# Patient Record
Sex: Male | Born: 1995 | Race: White | Hispanic: No | Marital: Single | Smoking: Never smoker
Health system: Southern US, Community
[De-identification: ages and names within clinical notes are randomized; demographics above are authoritative.]

## PROBLEM LIST (undated history)

## (undated) DIAGNOSIS — T7840XA Allergy, unspecified, initial encounter: Secondary | ICD-10-CM

## (undated) DIAGNOSIS — Q676 Pectus excavatum: Secondary | ICD-10-CM

## (undated) HISTORY — DX: Allergy, unspecified, initial encounter: T78.40XA

## (undated) HISTORY — DX: Pectus excavatum: Q67.6

---
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
## 2014-10-17 ENCOUNTER — Ambulatory Visit (INDEPENDENT_AMBULATORY_CARE_PROVIDER_SITE_OTHER): Payer: Managed Care, Other (non HMO) | Admitting: Emergency Medicine

## 2014-10-17 VITALS — BP 124/82 | HR 75 | Temp 97.5°F | Resp 18 | Ht 67.0 in | Wt 134.0 lb

## 2014-10-17 DIAGNOSIS — S81812A Laceration without foreign body, left lower leg, initial encounter: Secondary | ICD-10-CM

## 2014-10-17 DIAGNOSIS — Z23 Encounter for immunization: Secondary | ICD-10-CM

## 2014-10-17 DIAGNOSIS — M79605 Pain in left leg: Secondary | ICD-10-CM | POA: Diagnosis not present

## 2014-10-17 NOTE — Progress Notes (Signed)
Subjective:  Patient ID: Darren Horn, male    DOB: [birth year]  Age: 19 y.o. MRN: [id number]  CC: Leg Injury   HPI Darren AntuZachary T Kil presents  with a laceration of his leg. He apparently doing something with a knife and slipped and dropped on his right lateral Thigh. His current no neurovascular complaint. Not current on tetanus he has moderate pain in his right lateral thigh  History Darren Horn has a past medical history of Pectus excavatum and Allergy.   He has no past surgical history on file.   His  family history includes Asthma in his father; Cancer in his maternal grandmother; Pectus excavatum in his brother.  He   reports that he has never smoked. He does not have any smokeless tobacco history on file. He reports that he does not drink alcohol or use illicit drugs.  Outpatient Prescriptions Prior to Visit  Medication Sig Dispense Refill  . amoxicillin (AMOXIL) 875 MG tablet Take 1 tablet (875 mg total) by mouth 2 (two) times daily. (Patient not taking: Reported on 10/17/2014) 20 tablet 0  . Diphenhyd-Hydrocort-Nystatin (FIRST-DUKES MOUTHWASH) SUSP Take 10 mLs by mouth every 2 (two) hours as needed. With 1:1 ratio viscous lidocaine (Patient not taking: Reported on 10/17/2014) 360 mL 0  . albuterol (PROVENTIL HFA;VENTOLIN HFA) 108 (90 BASE) MCG/ACT inhaler Inhale 2 puffs into the lungs every 4 (four) hours as needed for wheezing. 1 Inhaler 0   No facility-administered medications prior to visit.    History   Social History  . Marital Status: Single    Spouse Name: N/A  . Number of Children: N/A  . Years of Education: N/A   Social History Main Topics  . Smoking status: Never Smoker   . Smokeless tobacco: Not on file  . Alcohol Use: No  . Drug Use: No  . Sexual Activity: Not Currently   Other Topics Concern  . None   Social History Narrative     Review of Systems  Constitutional: Negative for fever, chills and appetite change.  HENT: Negative for  congestion, ear pain, postnasal drip, sinus pressure and sore throat.   Eyes: Negative for pain and redness.  Respiratory: Negative for cough, shortness of breath and wheezing.   Cardiovascular: Negative for leg swelling.  Gastrointestinal: Negative for nausea, vomiting, abdominal pain, diarrhea, constipation and blood in stool.  Endocrine: Negative for polyuria.  Genitourinary: Negative for dysuria, urgency, frequency and flank pain.  Musculoskeletal: Negative for gait problem.  Skin: Negative for rash.  Neurological: Negative for weakness and headaches.  Psychiatric/Behavioral: Negative for confusion and decreased concentration. The patient is not nervous/anxious.     Objective:  BP 124/82 mmHg  Pulse 75  Temp(Src) 97.5 F (36.4 C) (Oral)  Resp 18  Ht 5\' 7"  (1.702 m)  Wt 134 lb (60.782 kg)  BMI 20.98 kg/m2  SpO2 98%  Physical Exam  Constitutional: He is oriented to person, place, and time. He appears well-developed and well-nourished.  HENT:  Head: Normocephalic and atraumatic.  Eyes: Conjunctivae are normal. Pupils are equal, round, and reactive to light.  Pulmonary/Chest: Effort normal.  Musculoskeletal: He exhibits no edema.  Neurological: He is alert and oriented to person, place, and time.  Skin: Skin is dry.  Psychiatric: He has a normal mood and affect. His behavior is normal. Thought content normal.      Assessment & Plan:   Darren Horn was seen today for leg injury.  Diagnoses and all orders for this visit:  Need for  Tdap vaccination Orders: -     Tdap vaccine greater than or equal to 7yo IM   I have discontinued Mr. Vivona albuterol. I am also having him maintain his FIRST-DUKES MOUTHWASH and amoxicillin.  No orders of the defined types were placed in this encounter.    Appropriate red flag conditions were discussed with the patient as well as actions that should be taken.  Patient expressed his understanding.  Follow-up: No Follow-up on  file.  Carmelina Dane, MD

## 2014-10-17 NOTE — Progress Notes (Signed)
Procedure Consent obtained. Local anesthesia with 6 cc 1% lido. Cleaned with soap and water. Wound explored. #7 5-0 simple interrupted. Clean dressing placed. Care instructions given.

## 2014-10-24 ENCOUNTER — Ambulatory Visit (INDEPENDENT_AMBULATORY_CARE_PROVIDER_SITE_OTHER): Payer: Managed Care, Other (non HMO) | Admitting: Physician Assistant

## 2014-10-24 VITALS — BP 110/70 | HR 69 | Temp 97.8°F | Resp 16 | Ht 67.0 in | Wt 133.8 lb

## 2014-10-24 DIAGNOSIS — Z4802 Encounter for removal of sutures: Secondary | ICD-10-CM

## 2014-10-24 NOTE — Progress Notes (Signed)
[facility] [address], [place] [address] [phone]  Date:  10/24/2014   Name:  Darren Horn Seng   DOB:  10-Feb-1996   MRN:  [id number]  PCP:  No PCP Per Patient    History of Present Illness:  Darren Horn Dial is a 19 y.o. male patient who [facility] removal of a laceration at his right thigh status post suture repair 7 days ago. Patient reports no problems with the wound site he has been keeping it clean with soap and water. He has no swelling, increased redness, drainage, streaking or fever that he has noticed.   Patient Active Problem List   Diagnosis Date Noted  . Pectus excavatum 12/16/2012    Past Medical History  Diagnosis Date  . Pectus excavatum   . Allergy     History reviewed. No pertinent past surgical history.  History  Substance Use Topics  . Smoking status: Never Smoker   . Smokeless tobacco: Not on file  . Alcohol Use: No    Family History  Problem Relation Age of Onset  . Pectus excavatum Brother   . Asthma Father   . Cancer Maternal Grandmother     No Known Allergies  Medication list has been reviewed and updated.  Current Outpatient Prescriptions on File Prior to Visit  Medication Sig Dispense Refill  . amoxicillin (AMOXIL) 875 MG tablet Take 1 tablet (875 mg total) by mouth 2 (two) times daily. (Patient not taking: Reported on 10/17/2014) 20 tablet 0  . Diphenhyd-Hydrocort-Nystatin (FIRST-DUKES MOUTHWASH) SUSP Take 10 mLs by mouth every 2 (two) hours as needed. With 1:1 ratio viscous lidocaine (Patient not taking: Reported on 10/17/2014) 360 mL 0   No current facility-administered medications on file prior to visit.    ROS ROS otherwise unremarkable unless listed above.  Physical Examination: BP 110/70 mmHg  Pulse 69  Temp(Src) 97.8 F (36.6 C) (Oral)  Resp 16  Ht 5\' 7"  (1.702 m)  Wt 133 lb 12.8 oz (60.691 kg)  BMI 20.95 kg/m2  SpO2 99% Ideal Body Weight: Weight in (lb) to have BMI = 25:  159.3  Physical Exam Alert, cooperative, oriented 4 in no acute distress. Right thigh with repaired laceration. Sutures are intact. There is no drainage or sanguinous fluid from the wound site. Sutures were removed without any complication and it was cleansed with soap and water upon  Assessment and Plan: 19 year old male with past medical history listed above is here today for suture removal. Laceration is healing well. Sutures were removed. Advised to keep wound site clean with soap and water and advised him of alarming symptoms that would warrant return.  Visit for suture removal    Trena PlattStephanie Sady Monaco, PA-C [facility] 10/24/2014 12:31 PM

## 2014-10-24 NOTE — Patient Instructions (Signed)
The sutures came out fine.  Please make sure you keep the are clean with soap and water twice per day.  If you notice increase swelling, drainage, bleeding, or pain in this area, please return to the clinic.

## 2015-02-12 ENCOUNTER — Telehealth: Payer: Self-pay

## 2015-02-12 NOTE — Telephone Encounter (Signed)
Received a request for records and bills from [employer] [facility] [employer]. Faxed an invoice for $10.00 on 02/12/2015. Awaiting payment. Records filed in the bottom drawer.

## 2015-02-14 NOTE — Telephone Encounter (Signed)
Scanned in the itemized bill did not see any paper work in the draw.

## 2015-03-01 DIAGNOSIS — Z0271 Encounter for disability determination: Secondary | ICD-10-CM

## 2015-11-09 ENCOUNTER — Emergency Department (HOSPITAL_COMMUNITY): Payer: Managed Care, Other (non HMO)

## 2015-11-09 ENCOUNTER — Ambulatory Visit (INDEPENDENT_AMBULATORY_CARE_PROVIDER_SITE_OTHER): Payer: Managed Care, Other (non HMO)

## 2015-11-09 ENCOUNTER — Ambulatory Visit (INDEPENDENT_AMBULATORY_CARE_PROVIDER_SITE_OTHER)
Admission: EM | Admit: 2015-11-09 | Discharge: 2015-11-09 | Disposition: A | Discharge status: Nursing Home (Includes ICF) | Payer: Managed Care, Other (non HMO) | Source: Home / Self Care | Attending: Emergency Medicine | Admitting: Emergency Medicine

## 2015-11-09 ENCOUNTER — Encounter (HOSPITAL_COMMUNITY): Payer: Self-pay | Admitting: *Deleted

## 2015-11-09 ENCOUNTER — Encounter (HOSPITAL_COMMUNITY): Payer: Self-pay | Admitting: Emergency Medicine

## 2015-11-09 ENCOUNTER — Inpatient Hospital Stay (HOSPITAL_COMMUNITY)
Admission: EM | Admit: 2015-11-09 | Discharge: 2015-11-13 | DRG: 482 | Disposition: A | Discharge status: Home / Self Care | Payer: Managed Care, Other (non HMO) | Attending: Family Medicine | Admitting: Family Medicine

## 2015-11-09 DIAGNOSIS — E876 Hypokalemia: Secondary | ICD-10-CM | POA: Diagnosis present

## 2015-11-09 DIAGNOSIS — S72142A Displaced intertrochanteric fracture of left femur, initial encounter for closed fracture: Secondary | ICD-10-CM

## 2015-11-09 DIAGNOSIS — T148XXA Other injury of unspecified body region, initial encounter: Secondary | ICD-10-CM

## 2015-11-09 DIAGNOSIS — S72142D Displaced intertrochanteric fracture of left femur, subsequent encounter for closed fracture with routine healing: Secondary | ICD-10-CM | POA: Diagnosis not present

## 2015-11-09 DIAGNOSIS — S70312A Abrasion, left thigh, initial encounter: Secondary | ICD-10-CM | POA: Diagnosis present

## 2015-11-09 DIAGNOSIS — D696 Thrombocytopenia, unspecified: Secondary | ICD-10-CM | POA: Diagnosis present

## 2015-11-09 DIAGNOSIS — W1789XA Other fall from one level to another, initial encounter: Secondary | ICD-10-CM | POA: Diagnosis present

## 2015-11-09 DIAGNOSIS — M25552 Pain in left hip: Secondary | ICD-10-CM | POA: Diagnosis present

## 2015-11-09 DIAGNOSIS — Y92832 Beach as the place of occurrence of the external cause: Secondary | ICD-10-CM | POA: Diagnosis not present

## 2015-11-09 LAB — BASIC METABOLIC PANEL
ANION GAP: 7 (ref 5–15)
BUN: 12 mg/dL (ref 6–20)
CALCIUM: 9.1 mg/dL (ref 8.9–10.3)
CO2: 26 mmol/L (ref 22–32)
Chloride: 103 mmol/L (ref 101–111)
Creatinine, Ser: 0.77 mg/dL (ref 0.61–1.24)
GFR calc Af Amer: >60 mL/min (ref >60)
GFR calc non Af Amer: >60 mL/min (ref >60)
GLUCOSE: 98 mg/dL (ref 65–99)
Potassium: 3.4 mmol/L — ABNORMAL LOW (ref 3.5–5.1)
Sodium: 136 mmol/L (ref 135–145)

## 2015-11-09 LAB — CBC WITH DIFFERENTIAL/PLATELET
BASOS PCT: 0 %
Basophils Absolute: 0 10*3/uL (ref 0.0–0.1)
EOS PCT: 2 %
Eosinophils Absolute: 0.2 10*3/uL (ref 0.0–0.7)
HEMATOCRIT: 44.1 % (ref 39.0–52.0)
Hemoglobin: 15.3 g/dL (ref 13.0–17.0)
Lymphocytes Relative: 24 %
Lymphs Abs: 2 10*3/uL (ref 0.7–4.0)
MCH: 28.5 pg (ref 26.0–34.0)
MCHC: 34.7 g/dL (ref 30.0–36.0)
MCV: 82.3 fL (ref 78.0–100.0)
MONO ABS: 0.7 10*3/uL (ref 0.1–1.0)
MONOS PCT: 9 %
NEUTROS ABS: 5.5 10*3/uL (ref 1.7–7.7)
Neutrophils Relative %: 65 %
Platelets: 120 10*3/uL — ABNORMAL LOW (ref 150–400)
RBC: 5.36 MIL/uL (ref 4.22–5.81)
RDW: 12.3 % (ref 11.5–15.5)
WBC: 8.4 10*3/uL (ref 4.0–10.5)

## 2015-11-09 LAB — URINALYSIS, ROUTINE W REFLEX MICROSCOPIC
GLUCOSE, UA: 100 mg/dL — AB
Hgb urine dipstick: NEGATIVE
KETONES UR: 15 mg/dL — AB
Leukocytes, UA: NEGATIVE
NITRITE: NEGATIVE
PH: 6.5 (ref 5.0–8.0)
Protein, ur: 30 mg/dL — AB
Specific Gravity, Urine: 1.037 — ABNORMAL HIGH (ref 1.005–1.030)

## 2015-11-09 LAB — URINE MICROSCOPIC-ADD ON: RBC / HPF: NONE SEEN RBC/hpf (ref 0–5)

## 2015-11-09 LAB — TYPE AND SCREEN
ABO/RH(D): A NEG
Antibody Screen: NEGATIVE

## 2015-11-09 MED ORDER — MORPHINE SULFATE (PF) 4 MG/ML IV SOLN
4.0000 mg | Freq: Once | INTRAVENOUS | Status: AC
  Administered 2015-11-09: 4 mg via INTRAVENOUS
  Filled 2015-11-09: qty 1

## 2015-11-09 MED ORDER — POTASSIUM CHLORIDE CRYS ER 20 MEQ PO TBCR
40.0000 meq | EXTENDED_RELEASE_TABLET | Freq: Once | ORAL | Status: AC
  Administered 2015-11-09: 40 meq via ORAL
  Filled 2015-11-09: qty 2

## 2015-11-09 MED ORDER — IBUPROFEN 800 MG PO TABS
ORAL_TABLET | ORAL | Status: AC
  Filled 2015-11-09: qty 1

## 2015-11-09 MED ORDER — HYDROMORPHONE HCL 1 MG/ML IJ SOLN
INTRAMUSCULAR | Status: AC
  Filled 2015-11-09: qty 2

## 2015-11-09 MED ORDER — IBUPROFEN 800 MG PO TABS
800.0000 mg | ORAL_TABLET | Freq: Once | ORAL | Status: AC
  Administered 2015-11-09: 800 mg via ORAL

## 2015-11-09 MED ORDER — HYDROMORPHONE HCL 1 MG/ML IJ SOLN
2.0000 mg | Freq: Once | INTRAMUSCULAR | Status: AC
  Administered 2015-11-09: 2 mg via INTRAMUSCULAR

## 2015-11-09 NOTE — ED Notes (Signed)
Report    Phoned  To sabrina     Film/video editor

## 2015-11-09 NOTE — H&P (Signed)
History and Physical  Patient Name: Darren Horn     ZOX:[id number]    DOB: 1995-04-17    DOA: 11/09/2015 Referring provider: Ophelia Charter PCP: No PCP Per Patient   Patient coming from: Home     Chief Complaint: Hip pain and fall  HPI: Darren Horn is a 20 y.o. male with no significant past medical history who presents with hip pain after a fall.  The patient was in his normal state of health until yesterday evening when he was down in [place] with his brother, was walking on a concrete embankment (maybe 18 in. wide) when he slipped off and fell about three feet landing on concrete on his LEFT side/hip.  He had immediate LEFT hip pain.  It was around 7PM when it happened, around the time of the last ferry off [address], and he and his brother decided they could wait the night because he was able to limp, but overnight he was in a lot of pain, and so today they took the first ferry off the [place] and drove straight home and came to the ER.  In the ED, the patient was LEFT and a plain radiograph of the pelvis showed a minimally displaced left intertrochanteric hip fracure.  The case was discussed with Dr. Ophelia Charter who agreed to see the patient, and [facility] were asked to admit for medical management.    There was no preceding dizziness, weakness, lightheadedness or vertigo, no chest discomfort, palpitations, or dyspnea, nor are there any of those symptoms now.  The patient denies active heart issues, angina, or history of MI. He is active and able to exert >4 METS without difficulty.  He has no history of TIAs/CVAs, CAD, CHF, or DM treated with insulin. He does not use alcohol, and has no history of withdrawal symptoms.  Anesthesia Specific concerns: Presence of loose teeth: None Anesthesia problems in past: None History of bleeding disorder: None  Review of Systems:  Pt complains of LEFT hip pain with movement. All other systems negative except as just noted or noted in the history of  present illness.        Past Medical History:  Diagnosis Date  . Allergy   . Pectus excavatum     History reviewed. No pertinent surgical history.  Had some eye surgeries as a baby.  Social History: Patient lives with his parents.  He does not work.  Patient walks unassisted.  No smoking.    No Known Allergies  Family history: family history includes Asthma in his father; Cancer in his maternal grandmother; Pectus excavatum in his brother.  Prior to Admission medications   None       Physical Exam: BP 115/79   Pulse (!) 50   Temp 98.3 F (36.8 C) (Oral)   Resp 18   SpO2 97%  General appearance: Thin adult male, alert and in no acute distress.  Possible developmental delay.   Eyes: Anicteric, conjunctiva pink, wide, mild amblyopia.  Lids and lashes normal.     ENT: No nasal deformity, discharge, or epistaxis.  OP moist without lesions.   Skin: Warm and dry.  No jaundice.  No suspicious rashes or lesions.  Bilateral ingrown 1st great toenails. Cardiac: Pectus. RRR, nl S1-S2, no murmurs appreciated.  Capillary refill is brisk.  JVP normal.  No LE edema.  Radial and DP pulses 2+ and symmetric. Respiratory: Normal respiratory rate and rhythm.  CTAB without rales or wheezes. GI: Abdomen soft without rigidity.  No  TTP. No ascites, distension.   MSK: No deformities or effusions. Neuro: Cranial nerves grossly intact.  Sensorium intact and responding to questions, attention normal.  Speech is fluent.  Moves all extremities equally and with normal coordination.    Psych: Behavior appropriate.  Affect normal.  No evidence of aural or visual hallucinations or delusions.       Labs on Admission:  I have personally reviewed following labs and imaging studies: CBC:  Recent Labs Lab 11/09/15 2100  WBC 8.4  NEUTROABS 5.5  HGB 15.3  HCT 44.1  MCV 82.3  PLT 120*   Basic Metabolic Panel:  Recent Labs Lab 11/09/15 2100  NA 136  K 3.4*  CL 103  CO2 26  GLUCOSE 98  BUN  12  CREATININE 0.77  CALCIUM 9.1   GFR: CrCl cannot be calculated (Unknown ideal weight.).     Radiological Exams on Admission: Personally reviewed: Dg Hip Unilat W Or Wo Pelvis 2-3 Views Left  Result Date: 11/09/2015 CLINICAL DATA:  20 year old male with history of trauma from a fall off of a 4 foot wall landing onto concrete. EXAM: DG HIP (WITH OR WITHOUT PELVIS) 2-3V LEFT COMPARISON:  Left femur radiograph 11/09/2015. FINDINGS: There is a very minimally displaced acute intertrochanteric left hip fracture. No significant angulation. Bony pelvis and right proximal femur as visualized appear intact. IMPRESSION: 1. Acute minimally displaced intertrochanteric fracture of the left hip. Electronically Signed   By: Trudie Reed M.D.   On: 11/09/2015 22:05   Dg Femur Min 2 Views Left  Result Date: 11/09/2015 CLINICAL DATA:  Pain, stepped off the wall yesterday evening. Unable to bear weight. Technologist notes state pain about the hip. EXAM: LEFT FEMUR 2 VIEWS COMPARISON:  None. FINDINGS: Mildly displaced fracture of the left proximal femur is likely intertrochanteric, however abuts the femoral neck. Distal femur is intact. Femoral head is seated in the acetabulum. IMPRESSION: Mildly displaced left proximal femur fracture, likely intertrochanteric. Recommend dedicated hip radiographs. These results will be called to the ordering clinician or representative by the Radiologist Assistant, and communication documented in the PACS or zVision Dashboard. Electronically Signed   By: Rubye Oaks M.D.   On: 11/09/2015 19:44        Assessment and Plan: 1. Hip fracture: The patient will be seen by Dr. Ophelia Charter in the morning at [facility], to evaluate for operative fixation of the LEFT hip.   -Admit to med-surg bed -Hydrocodone-acetaminophen as tolerated for pain -Bed rest, apply ice, document sedation and vitals per Hip fracture protocol -NPO at midnight -MIVF with K -Nutrition consulted -Check  vitamin D   Overall, the patient is at low risk for the planned surgery.  Patient has a RCRI score of 0 (active cardiac condition, CHF, CAD, DM treated with insulin, TIA/CVA, Cr > 2.0). The patient has no active cardiac symptoms, a functional capacity >4 METs, and would be expected to be at average risk for cardiac complications from this intermediate risk procedure. -No further testing needed    2. Hypokalemia: Clinically insignificant.       DVT prophylaxis: SCDs  Diet: NPO Code Status: FULL  Family Communication: Mother and brother at bedside.  Role of hospitalist discussed. Disposition Plan: Anticipate evaluation by Orthopedics and surgical fixation tomorrow, then PT evaluation and discharge to home in 2-3 days. Admission status: INPATIENT for hip fracture, medical surgical bed     Medical decision making and consults: Patient seen at 11:30 PM on 11/09/2015.  The patient was discussed with  Dr. Preston Fleeting. Clinical condition: stable.      Alberteen Sam [facility] Pager [phone]

## 2015-11-09 NOTE — ED Notes (Signed)
Patient being transported to x-ray.

## 2015-11-09 NOTE — ED Triage Notes (Addendum)
PER CARELINK: Patient to ED from [facility] with c/o L upper leg pain since 1830 last night. Patient states he was walking and fell off side of a wall onto concrete (4 feet), landing directly onto L leg. X-ray done at [facility] shows closed L femur fracture. No other injuries. CMS intact distal to injury, pulses 3+ bilaterally. Patient A&O x 4.

## 2015-11-09 NOTE — ED Triage Notes (Signed)
Pt  Darren Horn  And  Injured  His  Left  Lower  Leg   He  States  He  Is  Unable    To bear  Weight  On the  Affected    leg

## 2015-11-09 NOTE — ED Provider Notes (Signed)
CSN: [id number]     Arrival date & time 11/09/15  1655 History   First MD Initiated Contact with Patient 11/09/15 1837     Chief Complaint  Patient presents with  . Fall   (Consider location/radiation/quality/duration/timing/severity/associated sxs/prior Treatment)  HPI   The patient is a 20 year old male who [place] and came to retaining wall and stepped off of it thinking it was a flat level surface and fell approximately 2-3 feet landing on his left leg. Patient denies pain in his left foot, left tib-fib, left knee; but states he has anterior thigh pain and is unable to bear weight. Patient describes this discomfort as "aching" and denies numbness or tingling has taken nothing for pain other than Tylenol at approximately 11:15 this morning. Patient was apparently able to bear weight yesterday at some point which is why they didn't have him removed from the [place]. He drove home and a car for several hours today.   Past Medical History:  Diagnosis Date  . Allergy   . Pectus excavatum    History reviewed. No pertinent surgical history. Family History  Problem Relation Age of Onset  . Pectus excavatum Brother   . Asthma Father   . Cancer Maternal Grandmother    Social History  Substance Use Topics  . Smoking status: Never Smoker  . Smokeless tobacco: Not on file  . Alcohol use No    Review of Systems  Constitutional: Negative.   HENT: Negative.   Eyes: Negative.   Respiratory: Negative.   Cardiovascular: Negative.   Gastrointestinal: Negative.   Endocrine: Negative.   Genitourinary: Negative.   Musculoskeletal: Negative.  Negative for joint swelling.       Complains of pain across anterior thigh and inability to bear weight.   Skin: Negative.  Negative for wound.  Allergic/Immunologic: Negative.   Neurological: Negative.   Hematological: Negative.   Psychiatric/Behavioral: Negative.     Allergies  Review of patient's allergies  indicates no known allergies.  Home Medications   Prior to Admission medications   Medication Sig Start Date End Date Taking? Authorizing Provider  amoxicillin (AMOXIL) 875 MG tablet Take 1 tablet (875 mg total) by mouth 2 (two) times daily. Patient not taking: Reported on 10/17/2014 05/03/12   Godfrey Pick, PA-C  Diphenhyd-Hydrocort-Nystatin (FIRST-DUKES MOUTHWASH) SUSP Take 10 mLs by mouth every 2 (two) hours as needed. With 1:1 ratio viscous lidocaine Patient not taking: Reported on 10/17/2014 05/03/12   Godfrey Pick, PA-C   Meds Ordered and Administered this Visit   Medications  ibuprofen (ADVIL,MOTRIN) tablet 800 mg (800 mg Oral Given 11/09/15 1929)  HYDROmorphone (DILAUDID) injection 2 mg (2 mg Intramuscular Given 11/09/15 2006)    BP 130/72 (BP Location: Right Arm)   Pulse 78   Temp 98.6 F (37 C) (Oral)   Resp 18   SpO2 100%  No data found.   Physical Exam  Constitutional: He is oriented to person, place, and time. He appears well-developed and well-nourished. No distress.  Cardiovascular: Normal rate, regular rhythm, normal heart sounds and intact distal pulses.  Exam reveals no gallop and no friction rub.   No murmur heard. Pulmonary/Chest: Effort normal and breath sounds normal. No respiratory distress. He has no wheezes. He has no rales. He exhibits no tenderness.  Musculoskeletal:       Right upper leg: He exhibits tenderness and bony tenderness. He exhibits no swelling, no edema, no deformity and no laceration.  The patient is  unable to ambulate to treatment area or transfer from wheelchair to examination table.  No surface trauma, bruising.  No erythema or warmth. No deformity, crepitus, or obvious asymmetry of the affected leg compared to the other. Distal motor and neurovascular status intact.  2+ dorsalis pedal pulses bilaterally, left foot warm to touch and CMS intact.    Neurological: He is alert and oriented to person, place, and time.  Skin: Skin is warm and  dry. Capillary refill takes less than 2 seconds. He is not diaphoretic. No pallor.  Nursing note and vitals reviewed.   Urgent Care Course   Clinical Course   Patient was offered an IM injection prior to x-rays. The patient refused and requested oral ibuprofen.  Procedures (including critical care time)  Labs Review Labs Reviewed - No data to display  Imaging Review Dg Femur Min 2 Views Left  Result Date: 11/09/2015 CLINICAL DATA:  Pain, stepped off the wall yesterday evening. Unable to bear weight. Technologist notes state pain about the hip. EXAM: LEFT FEMUR 2 VIEWS COMPARISON:  None. FINDINGS: Mildly displaced fracture of the left proximal femur is likely intertrochanteric, however abuts the femoral neck. Distal femur is intact. Femoral head is seated in the acetabulum. IMPRESSION: Mildly displaced left proximal femur fracture, likely intertrochanteric. Recommend dedicated hip radiographs. These results will be called to the ordering clinician or representative by the Radiologist Assistant, and communication documented in the PACS or zVision Dashboard. Electronically Signed   By: Rubye Oaks M.D.   On: 11/09/2015 19:44      MDM   1. Intertrochanteric fracture of left femur, closed, initial encounter (HCC)    Meds ordered this encounter  Medications  . ibuprofen (ADVIL,MOTRIN) tablet 800 mg  . HYDROmorphone (DILAUDID) injection 2 mg    The usual and customary discharge instructions and warnings were given.  The patient verbalizes understanding and agrees to plan of care.      Servando Salina, NP 11/09/15 2043

## 2015-11-09 NOTE — ED Provider Notes (Signed)
[facility]-EMERGENCY DEPT Provider Note   CSN: [id number] Arrival date & time: 11/09/15  2044  First Provider Contact:  None       History   Chief Complaint Chief Complaint  Patient presents with  . Leg Pain    HPI Darren Horn is a 20 y.o. male.  The history is provided by the patient.  [place] injuring his left hip. He has tried and played since then, but has been too painful. He did sever some abrasions to his left leg. He denies other injury. He went to urgent care where x-rays showed a hip fracture and he was referred to the ED.  Past Medical History:  Diagnosis Date  . Allergy   . Pectus excavatum     Patient Active Problem List   Diagnosis Date Noted  . Pectus excavatum 12/16/2012    History reviewed. No pertinent surgical history.     Home Medications    Prior to Admission medications   Medication Sig Start Date End Date Taking? Authorizing Provider  amoxicillin (AMOXIL) 875 MG tablet Take 1 tablet (875 mg total) by mouth 2 (two) times daily. Patient not taking: Reported on 10/17/2014 05/03/12   Godfrey Pick, PA-C  Diphenhyd-Hydrocort-Nystatin (FIRST-DUKES MOUTHWASH) SUSP Take 10 mLs by mouth every 2 (two) hours as needed. With 1:1 ratio viscous lidocaine Patient not taking: Reported on 10/17/2014 05/03/12   Godfrey Pick, PA-C    Family History Family History  Problem Relation Age of Onset  . Pectus excavatum Brother   . Asthma Father   . Cancer Maternal Grandmother     Social History Social History  Substance Use Topics  . Smoking status: Never Smoker  . Smokeless tobacco: Never Used  . Alcohol use No     Allergies   Review of patient's allergies indicates no known allergies.   Review of Systems Review of Systems  All other systems reviewed and are negative.    Physical Exam Updated Vital Signs BP 127/90 (BP Location: Right Arm)   Pulse 65   Temp 98.3 F (36.8 C) (Oral)    Resp 18   SpO2 98%   Physical Exam  Nursing note and vitals reviewed.  20 year old male, resting comfortably and in no acute distress. Vital signs are normal. Oxygen saturation is 98%, which is normal. Head is normocephalic and atraumatic. PERRLA, EOMI. Oropharynx is clear. Neck is nontender and supple without adenopathy or JVD. Back is nontender and there is no CVA tenderness. Lungs are clear without rales, wheezes, or rhonchi. Chest is nontender. Heart has regular rate and rhythm without murmur. Abdomen is soft, flat, nontender without masses or hepatosplenomegaly and peristalsis is normoactive. Extremities have no cyanosis or edema. There is moderate tenderness to palpation of the left hip and severe pain with passive range of motion. Distal neurovascular exam is intact. Abrasions are seen over the distal left thigh, lateral left knee, proximal left lower leg. Skin is warm and dry without rash. Neurologic: Mental status is normal, cranial nerves are intact, there are no motor or sensory deficits.  ED Treatments / Results  Labs (all labs ordered are listed, but only abnormal results are displayed) Labs Reviewed  BASIC METABOLIC PANEL - Abnormal; Notable for the following:       Result Value   Potassium 3.4 (*)    All other components within normal limits  CBC WITH DIFFERENTIAL/PLATELET - Abnormal; Notable for the  following:    Platelets 120 (*)    All other components within normal limits  URINALYSIS, ROUTINE W REFLEX MICROSCOPIC (NOT AT [facility]) - Abnormal; Notable for the following:    Color, Urine AMBER (*)    Specific Gravity, Urine 1.037 (*)    Glucose, UA 100 (*)    Bilirubin Urine SMALL (*)    Ketones, ur 15 (*)    Protein, ur 30 (*)    All other components within normal limits  URINE MICROSCOPIC-ADD ON - Abnormal; Notable for the following:    Squamous Epithelial / LPF 0-5 (*)    Bacteria, UA RARE (*)    All other components within normal limits  TYPE AND SCREEN    ABO/RH   Radiology Dg Femur Min 2 Views Left  Result Date: 11/09/2015 CLINICAL DATA:  Pain, stepped off the wall yesterday evening. Unable to bear weight. Technologist notes state pain about the hip. EXAM: LEFT FEMUR 2 VIEWS COMPARISON:  None. FINDINGS: Mildly displaced fracture of the left proximal femur is likely intertrochanteric, however abuts the femoral neck. Distal femur is intact. Femoral head is seated in the acetabulum. IMPRESSION: Mildly displaced left proximal femur fracture, likely intertrochanteric. Recommend dedicated hip radiographs. These results will be called to the ordering clinician or representative by the Radiologist Assistant, and communication documented in the PACS or zVision Dashboard. Electronically Signed   By: Rubye Oaks M.D.   On: 11/09/2015 19:44    Procedures Procedures (including critical care time)  Medications Ordered in ED Medications  morphine 4 MG/ML injection 4 mg (4 mg Intravenous Given 11/09/15 2127)  potassium chloride SA (K-DUR,KLOR-CON) CR tablet 40 mEq (40 mEq Oral Given 11/09/15 2319)     Initial Impression / Assessment and Plan / ED Course  I have reviewed the triage vital signs and the nursing notes.  Pertinent labs & imaging results that were available during my care of the patient were reviewed by me and considered in my medical decision making (see chart for details).  Clinical Course    Left hip fracture, probably intertrochanteric. I have discussed the case with Dr. Kevan Ny, on call for orthopedics, who requests dedicated hip x-rays be obtained and that patient be admitted to the medicine service. Case is discussed with Dr. Maryfrances Bunnell of [facility] who agrees to admit the patient.  Final Clinical Impressions(s) / ED Diagnoses   Final diagnoses:  Closed intertrochanteric fracture of left hip, initial encounter [facility])    New Prescriptions New Prescriptions   No medications on file     Dione Booze, MD 11/09/15 2343

## 2015-11-09 NOTE — ED Notes (Signed)
npo

## 2015-11-09 NOTE — ED Notes (Signed)
Provided patient with [place] sandwich and sprite per MD. Patient tolerating well.

## 2015-11-10 ENCOUNTER — Inpatient Hospital Stay (HOSPITAL_COMMUNITY): Payer: Managed Care, Other (non HMO) | Admitting: Anesthesiology

## 2015-11-10 ENCOUNTER — Encounter (HOSPITAL_COMMUNITY)
Admission: EM | Disposition: A | Discharge status: Home / Self Care | Payer: Self-pay | Source: Home / Self Care | Attending: Internal Medicine

## 2015-11-10 ENCOUNTER — Encounter (HOSPITAL_COMMUNITY): Payer: Self-pay | Admitting: Certified Registered"

## 2015-11-10 ENCOUNTER — Inpatient Hospital Stay (HOSPITAL_COMMUNITY): Payer: Managed Care, Other (non HMO)

## 2015-11-10 DIAGNOSIS — S72142D Displaced intertrochanteric fracture of left femur, subsequent encounter for closed fracture with routine healing: Secondary | ICD-10-CM

## 2015-11-10 HISTORY — PX: INTRAMEDULLARY (IM) NAIL INTERTROCHANTERIC: SHX5875

## 2015-11-10 LAB — SURGICAL PCR SCREEN
MRSA, PCR: NEGATIVE
Staphylococcus aureus: POSITIVE — AB

## 2015-11-10 LAB — BASIC METABOLIC PANEL
ANION GAP: 6 (ref 5–15)
BUN: 12 mg/dL (ref 6–20)
CALCIUM: 8.9 mg/dL (ref 8.9–10.3)
CO2: 28 mmol/L (ref 22–32)
Chloride: 103 mmol/L (ref 101–111)
Creatinine, Ser: 0.89 mg/dL (ref 0.61–1.24)
GLUCOSE: 128 mg/dL — AB (ref 65–99)
POTASSIUM: 3.9 mmol/L (ref 3.5–5.1)
Sodium: 137 mmol/L (ref 135–145)

## 2015-11-10 LAB — ABO/RH: ABO/RH(D): A NEG

## 2015-11-10 LAB — CBC
HCT: 42.1 % (ref 39.0–52.0)
Hemoglobin: 14.2 g/dL (ref 13.0–17.0)
MCH: 28.3 pg (ref 26.0–34.0)
MCHC: 33.7 g/dL (ref 30.0–36.0)
MCV: 83.9 fL (ref 78.0–100.0)
PLATELETS: 123 10*3/uL — AB (ref 150–400)
RBC: 5.02 MIL/uL (ref 4.22–5.81)
RDW: 12.5 % (ref 11.5–15.5)
WBC: 7.2 10*3/uL (ref 4.0–10.5)

## 2015-11-10 SURGERY — FIXATION, FRACTURE, INTERTROCHANTERIC, WITH INTRAMEDULLARY ROD
Anesthesia: General | Site: Hip | Laterality: Left

## 2015-11-10 MED ORDER — METOCLOPRAMIDE HCL 5 MG PO TABS: 5.0000 mg | ORAL_TABLET | Freq: Three times a day (TID) | ORAL | Status: DC | PRN

## 2015-11-10 MED ORDER — MENTHOL 3 MG MT LOZG: 1.0000 | LOZENGE | OROMUCOSAL | Status: DC | PRN

## 2015-11-10 MED ORDER — MORPHINE SULFATE (PF) 2 MG/ML IV SOLN
0.5000 mg | INTRAVENOUS | Status: DC | PRN
  Administered 2015-11-10: 0.5 mg via INTRAVENOUS

## 2015-11-10 MED ORDER — METHOCARBAMOL 500 MG PO TABS
500.0000 mg | ORAL_TABLET | Freq: Four times a day (QID) | ORAL | Status: DC | PRN
  Administered 2015-11-10 – 2015-11-12 (×3): 500 mg via ORAL
  Filled 2015-11-10 (×3): qty 1

## 2015-11-10 MED ORDER — POLYETHYLENE GLYCOL 3350 17 G PO PACK: 17.0000 g | PACK | Freq: Every day | ORAL | Status: DC | PRN

## 2015-11-10 MED ORDER — PROMETHAZINE HCL 25 MG/ML IJ SOLN: 6.2500 mg | INTRAMUSCULAR | Status: DC | PRN

## 2015-11-10 MED ORDER — MORPHINE SULFATE (PF) 2 MG/ML IV SOLN
0.5000 mg | INTRAVENOUS | Status: DC | PRN
Start: 2015-11-10 — End: 2015-11-11
  Administered 2015-11-10: 0.5 mg via INTRAVENOUS
  Filled 2015-11-10 (×2): qty 1

## 2015-11-10 MED ORDER — LACTATED RINGERS IV SOLN
INTRAVENOUS | Status: DC | PRN
  Administered 2015-11-10 (×2): via INTRAVENOUS

## 2015-11-10 MED ORDER — METHOCARBAMOL 1000 MG/10ML IJ SOLN
500.0000 mg | Freq: Four times a day (QID) | INTRAVENOUS | Status: DC | PRN
  Filled 2015-11-10: qty 5

## 2015-11-10 MED ORDER — ASPIRIN EC 325 MG PO TBEC
325.0000 mg | DELAYED_RELEASE_TABLET | Freq: Every day | ORAL | Status: DC
  Administered 2015-11-10: 325 mg via ORAL
  Filled 2015-11-10: qty 1

## 2015-11-10 MED ORDER — BUPIVACAINE-EPINEPHRINE 0.5% -1:200000 IJ SOLN
INTRAMUSCULAR | Status: DC | PRN
  Administered 2015-11-10: 20 mL

## 2015-11-10 MED ORDER — HYDROCODONE-ACETAMINOPHEN 5-325 MG PO TABS: 1.0000 | ORAL_TABLET | Freq: Four times a day (QID) | ORAL | Status: DC | PRN

## 2015-11-10 MED ORDER — PHENOL 1.4 % MT LIQD: 1.0000 | OROMUCOSAL | Status: DC | PRN

## 2015-11-10 MED ORDER — CEFAZOLIN SODIUM 1 G IJ SOLR
INTRAMUSCULAR | Status: DC | PRN
  Administered 2015-11-10: 2 g via INTRAMUSCULAR

## 2015-11-10 MED ORDER — FENTANYL CITRATE (PF) 250 MCG/5ML IJ SOLN
INTRAMUSCULAR | Status: AC
  Filled 2015-11-10: qty 5

## 2015-11-10 MED ORDER — EPHEDRINE SULFATE-NACL 50-0.9 MG/10ML-% IV SOSY
PREFILLED_SYRINGE | INTRAVENOUS | Status: DC | PRN
  Administered 2015-11-10: 10 mg via INTRAVENOUS

## 2015-11-10 MED ORDER — DEXAMETHASONE SODIUM PHOSPHATE 10 MG/ML IJ SOLN
INTRAMUSCULAR | Status: DC | PRN
  Administered 2015-11-10: 10 mg via INTRAVENOUS

## 2015-11-10 MED ORDER — CEFAZOLIN SODIUM 1 G IJ SOLR
INTRAMUSCULAR | Status: AC
  Filled 2015-11-10: qty 20

## 2015-11-10 MED ORDER — ASPIRIN EC 325 MG PO TBEC
325.0000 mg | DELAYED_RELEASE_TABLET | Freq: Every day | ORAL | Status: DC
  Administered 2015-11-11 – 2015-11-13 (×3): 325 mg via ORAL
  Filled 2015-11-10 (×3): qty 1

## 2015-11-10 MED ORDER — CHLORHEXIDINE GLUCONATE CLOTH 2 % EX PADS
6.0000 | MEDICATED_PAD | Freq: Every day | CUTANEOUS | Status: DC
Start: 2015-11-10 — End: 2015-11-13
  Administered 2015-11-11 – 2015-11-13 (×2): 6 via TOPICAL

## 2015-11-10 MED ORDER — DOCUSATE SODIUM 100 MG PO CAPS
100.0000 mg | ORAL_CAPSULE | Freq: Two times a day (BID) | ORAL | Status: DC
  Administered 2015-11-10 – 2015-11-13 (×6): 100 mg via ORAL
  Filled 2015-11-10 (×6): qty 1

## 2015-11-10 MED ORDER — HYDROMORPHONE HCL 1 MG/ML IJ SOLN: 0.5000 mg | INTRAMUSCULAR | Status: DC | PRN

## 2015-11-10 MED ORDER — EPHEDRINE 5 MG/ML INJ
INTRAVENOUS | Status: AC
  Filled 2015-11-10: qty 10

## 2015-11-10 MED ORDER — LIDOCAINE 2% (20 MG/ML) 5 ML SYRINGE
INTRAMUSCULAR | Status: AC
  Filled 2015-11-10: qty 5

## 2015-11-10 MED ORDER — PROPOFOL 10 MG/ML IV BOLUS
INTRAVENOUS | Status: DC | PRN
  Administered 2015-11-10: 150 mg via INTRAVENOUS
  Administered 2015-11-10 (×2): 50 mg via INTRAVENOUS

## 2015-11-10 MED ORDER — BUPIVACAINE-EPINEPHRINE (PF) 0.5% -1:200000 IJ SOLN
INTRAMUSCULAR | Status: AC
  Filled 2015-11-10: qty 30

## 2015-11-10 MED ORDER — MUPIROCIN 2 % EX OINT
1.0000 | TOPICAL_OINTMENT | Freq: Two times a day (BID) | CUTANEOUS | Status: DC
Start: 2015-11-10 — End: 2015-11-13
  Administered 2015-11-10 – 2015-11-13 (×5): 1 via NASAL
  Filled 2015-11-10: qty 22

## 2015-11-10 MED ORDER — FENTANYL CITRATE (PF) 250 MCG/5ML IJ SOLN
INTRAMUSCULAR | Status: DC | PRN
  Administered 2015-11-10: 100 ug via INTRAVENOUS
  Administered 2015-11-10: 50 ug via INTRAVENOUS

## 2015-11-10 MED ORDER — LIDOCAINE 2% (20 MG/ML) 5 ML SYRINGE
INTRAMUSCULAR | Status: DC | PRN
  Administered 2015-11-10: 100 mg via INTRAVENOUS

## 2015-11-10 MED ORDER — METOCLOPRAMIDE HCL 5 MG/ML IJ SOLN: 5.0000 mg | Freq: Three times a day (TID) | INTRAMUSCULAR | Status: DC | PRN

## 2015-11-10 MED ORDER — ACETAMINOPHEN 325 MG PO TABS
650.0000 mg | ORAL_TABLET | Freq: Four times a day (QID) | ORAL | Status: DC | PRN
Start: 2015-11-10 — End: 2015-11-13

## 2015-11-10 MED ORDER — ONDANSETRON HCL 4 MG/2ML IJ SOLN
INTRAMUSCULAR | Status: AC
  Filled 2015-11-10: qty 2

## 2015-11-10 MED ORDER — 0.9 % SODIUM CHLORIDE (POUR BTL) OPTIME
TOPICAL | Status: DC | PRN
  Administered 2015-11-10: 1000 mL

## 2015-11-10 MED ORDER — ONDANSETRON HCL 4 MG/2ML IJ SOLN
INTRAMUSCULAR | Status: DC | PRN
  Administered 2015-11-10: 4 mg via INTRAVENOUS

## 2015-11-10 MED ORDER — HYDROMORPHONE HCL 1 MG/ML IJ SOLN
INTRAMUSCULAR | Status: AC
  Filled 2015-11-10: qty 1

## 2015-11-10 MED ORDER — HYDROCODONE-ACETAMINOPHEN 5-325 MG PO TABS
1.0000 | ORAL_TABLET | Freq: Four times a day (QID) | ORAL | Status: DC | PRN
  Administered 2015-11-10 – 2015-11-11 (×2): 1 via ORAL
  Filled 2015-11-10 (×2): qty 1

## 2015-11-10 MED ORDER — PROPOFOL 10 MG/ML IV BOLUS
INTRAVENOUS | Status: AC
  Filled 2015-11-10: qty 20

## 2015-11-10 MED ORDER — ACETAMINOPHEN 650 MG RE SUPP: 650.0000 mg | Freq: Four times a day (QID) | RECTAL | Status: DC | PRN

## 2015-11-10 MED ORDER — SUCCINYLCHOLINE CHLORIDE 200 MG/10ML IV SOSY
PREFILLED_SYRINGE | INTRAVENOUS | Status: AC
  Filled 2015-11-10: qty 10

## 2015-11-10 MED ORDER — SUCCINYLCHOLINE CHLORIDE 200 MG/10ML IV SOSY
PREFILLED_SYRINGE | INTRAVENOUS | Status: DC | PRN
  Administered 2015-11-10: 100 mg via INTRAVENOUS

## 2015-11-10 MED ORDER — POTASSIUM CHLORIDE IN NACL 20-0.9 MEQ/L-% IV SOLN
INTRAVENOUS | Status: DC
  Administered 2015-11-10: 03:00:00 via INTRAVENOUS
  Filled 2015-11-10: qty 1000

## 2015-11-10 MED ORDER — SODIUM CHLORIDE 0.45 % IV SOLN
INTRAVENOUS | Status: DC
  Administered 2015-11-10: 22:00:00 via INTRAVENOUS

## 2015-11-10 MED ORDER — ONDANSETRON HCL 4 MG/2ML IJ SOLN
4.0000 mg | Freq: Four times a day (QID) | INTRAMUSCULAR | Status: DC | PRN
  Administered 2015-11-10: 4 mg via INTRAVENOUS
  Filled 2015-11-10: qty 2

## 2015-11-10 MED ORDER — OXYCODONE HCL 5 MG PO TABS
5.0000 mg | ORAL_TABLET | ORAL | Status: DC | PRN
  Administered 2015-11-11 – 2015-11-12 (×2): 5 mg via ORAL
  Filled 2015-11-10 (×2): qty 1

## 2015-11-10 MED ORDER — MIDAZOLAM HCL 5 MG/5ML IJ SOLN
INTRAMUSCULAR | Status: DC | PRN
  Administered 2015-11-10: 2 mg via INTRAVENOUS

## 2015-11-10 MED ORDER — DEXAMETHASONE SODIUM PHOSPHATE 10 MG/ML IJ SOLN
INTRAMUSCULAR | Status: AC
  Filled 2015-11-10: qty 1

## 2015-11-10 MED ORDER — BISACODYL 10 MG RE SUPP: 10.0000 mg | Freq: Every day | RECTAL | Status: DC | PRN

## 2015-11-10 MED ORDER — HYDROMORPHONE HCL 1 MG/ML IJ SOLN
0.2500 mg | INTRAMUSCULAR | Status: DC | PRN
  Administered 2015-11-10 (×4): 0.5 mg via INTRAVENOUS

## 2015-11-10 MED ORDER — EPHEDRINE SULFATE 50 MG/ML IJ SOLN: INTRAMUSCULAR | Status: DC | PRN

## 2015-11-10 MED ORDER — KETOROLAC TROMETHAMINE 30 MG/ML IJ SOLN: 30.0000 mg | Freq: Once | INTRAMUSCULAR | Status: DC | PRN

## 2015-11-10 MED ORDER — ONDANSETRON HCL 4 MG PO TABS: 4.0000 mg | ORAL_TABLET | Freq: Four times a day (QID) | ORAL | Status: DC | PRN

## 2015-11-10 MED ORDER — MIDAZOLAM HCL 2 MG/2ML IJ SOLN
INTRAMUSCULAR | Status: AC
  Filled 2015-11-10: qty 2

## 2015-11-10 SURGICAL SUPPLY — 41 items
BIT DRILL CANN LG 4.3MM (BIT) ×1 IMPLANT
BNDG COHESIVE 4X5 TAN STRL (GAUZE/BANDAGES/DRESSINGS) ×2 IMPLANT
CANISTER SUCTION 2500CC (MISCELLANEOUS) ×2 IMPLANT
COVER MAYO STAND STRL (DRAPES) ×2 IMPLANT
COVER PERINEAL POST (MISCELLANEOUS) ×2 IMPLANT
COVER SURGICAL LIGHT HANDLE (MISCELLANEOUS) ×2 IMPLANT
DRAPE C-ARM 42X72 X-RAY (DRAPES) ×2 IMPLANT
DRAPE STERI IOBAN 125X83 (DRAPES) ×2 IMPLANT
DRILL BIT CANN LG 4.3MM (BIT) ×2
DRSG PAD ABDOMINAL 8X10 ST (GAUZE/BANDAGES/DRESSINGS) ×2 IMPLANT
DURAPREP 26ML APPLICATOR (WOUND CARE) ×2 IMPLANT
ELECT REM PT RETURN 9FT ADLT (ELECTROSURGICAL) ×2
ELECTRODE REM PT RTRN 9FT ADLT (ELECTROSURGICAL) ×1 IMPLANT
EVACUATOR 1/8 PVC DRAIN (DRAIN) IMPLANT
GAUZE SPONGE 4X4 12PLY STRL (GAUZE/BANDAGES/DRESSINGS) ×2 IMPLANT
GAUZE XEROFORM 1X8 LF (GAUZE/BANDAGES/DRESSINGS) ×2 IMPLANT
GLOVE BIOGEL PI IND STRL 8 (GLOVE) ×2 IMPLANT
GLOVE BIOGEL PI INDICATOR 8 (GLOVE) ×2
GLOVE ORTHO TXT STRL SZ7.5 (GLOVE) ×4 IMPLANT
GOWN STRL REUS W/ TWL LRG LVL3 (GOWN DISPOSABLE) ×1 IMPLANT
GOWN STRL REUS W/TWL 2XL LVL3 (GOWN DISPOSABLE) ×2 IMPLANT
GOWN STRL REUS W/TWL LRG LVL3 (GOWN DISPOSABLE) ×3 IMPLANT
GUIDEPIN 3.2X17.5 THRD DISP (PIN) ×2 IMPLANT
HIP FRAC NAIL LAG SCR 10.5X100 (Orthopedic Implant) ×1 IMPLANT
KIT BASIN OR (CUSTOM PROCEDURE TRAY) ×2 IMPLANT
KIT ROOM TURNOVER OR (KITS) ×2 IMPLANT
LINER BOOT UNIVERSAL DISP (MISCELLANEOUS) ×2 IMPLANT
MANIFOLD NEPTUNE II (INSTRUMENTS) ×2 IMPLANT
NAIL HIP FRACT 130D 9X180 (Orthopedic Implant) ×2 IMPLANT
NS IRRIG 1000ML POUR BTL (IV SOLUTION) ×2 IMPLANT
PACK GENERAL/GYN (CUSTOM PROCEDURE TRAY) ×2 IMPLANT
PAD ARMBOARD 7.5X6 YLW CONV (MISCELLANEOUS) ×4 IMPLANT
SCREW BONE CORTICAL 5.0X32 (Screw) ×2 IMPLANT
SCREW CANN THRD AFF 10.5X100 (Orthopedic Implant) ×1 IMPLANT
SPONGE GAUZE 4X4 12PLY STER LF (GAUZE/BANDAGES/DRESSINGS) ×2 IMPLANT
STAPLER VISISTAT 35W (STAPLE) ×2 IMPLANT
SUT VIC AB 0 CT1 27 (SUTURE) ×1
SUT VIC AB 0 CT1 27XBRD ANBCTR (SUTURE) ×1 IMPLANT
SUT VIC AB 2-0 CT1 27 (SUTURE) ×2
SUT VIC AB 2-0 CT1 TAPERPNT 27 (SUTURE) ×2 IMPLANT
WATER STERILE IRR 1000ML POUR (IV SOLUTION) ×2 IMPLANT

## 2015-11-10 NOTE — Anesthesia Preprocedure Evaluation (Signed)
Anesthesia Evaluation  Patient identified by MRN, date of birth, ID band Patient awake    Reviewed: Allergy & Precautions, NPO status , Patient's Chart, lab work & pertinent test results  Airway Mallampati: II  TM Distance: >3 FB Neck ROM: Full    Dental no notable dental hx.    Pulmonary neg pulmonary ROS,    Pulmonary exam normal breath sounds clear to auscultation       Cardiovascular negative cardio ROS Normal cardiovascular exam Rhythm:Regular Rate:Normal     Neuro/Psych negative neurological ROS  negative psych ROS   GI/Hepatic negative GI ROS, Neg liver ROS,   Endo/Other  negative endocrine ROS  Renal/GU negative Renal ROS  negative genitourinary   Musculoskeletal negative musculoskeletal ROS (+)   Abdominal   Peds negative pediatric ROS (+)  Hematology negative hematology ROS (+)   Anesthesia Other Findings   Reproductive/Obstetrics negative OB ROS                             Anesthesia Physical Anesthesia Plan  ASA: I and emergent  Anesthesia Plan: General   Post-op Pain Management:    Induction: Intravenous  Airway Management Planned: Oral ETT  Additional Equipment:   Intra-op Plan:   Post-operative Plan: Extubation in OR  Informed Consent: I have reviewed the patients History and Physical, chart, labs and discussed the procedure including the risks, benefits and alternatives for the proposed anesthesia with the patient or authorized representative who has indicated his/her understanding and acceptance.   Dental advisory given  Plan Discussed with: CRNA and Surgeon  Anesthesia Plan Comments:         Anesthesia Quick Evaluation  

## 2015-11-10 NOTE — Consult Note (Signed)
Reason for Consult:fall with left IT hip Fx Referring Physician: Karleen Hampshire MD  Darren Horn is an 20 y.o. male.  HPI: 20 yo lives with his parents does not work, was at [employer] and fell off 4 ft wall with left hip pain. Some pain with WB following morning he could not walk.   xrays at urgent care left IT hip Fx  Past Medical History:  Diagnosis Date  . Allergy   . Pectus excavatum     History reviewed. No pertinent surgical history.  Family History  Problem Relation Age of Onset  . Pectus excavatum Brother   . Asthma Father   . Cancer Maternal Grandmother     Social History:  reports that he has never smoked. He has never used smokeless tobacco. He reports that he does not drink alcohol or use drugs.  Allergies: No Known Allergies  Medications: I have reviewed the patient's current medications.  Results for orders placed or performed during the hospital encounter of 11/09/15 (from the past 48 hour(s))  Basic metabolic panel     Status: Abnormal   Collection Time: 11/09/15  9:00 PM  Result Value Ref Range   Sodium 136 135 - 145 mmol/L   Potassium 3.4 (L) 3.5 - 5.1 mmol/L   Chloride 103 101 - 111 mmol/L   CO2 26 22 - 32 mmol/L   Glucose, Bld 98 65 - 99 mg/dL   BUN 12 6 - 20 mg/dL   Creatinine, Ser 0.77 0.61 - 1.24 mg/dL   Calcium 9.1 8.9 - 10.3 mg/dL   GFR calc non Af Amer >60 >60 mL/min   GFR calc Af Amer >60 >60 mL/min    Comment: (NOTE) The eGFR has been calculated using the CKD EPI equation. This calculation has not been validated in all clinical situations. eGFR's persistently <60 mL/min signify possible Chronic Kidney Disease.    Anion gap 7 5 - 15  CBC with Differential     Status: Abnormal   Collection Time: 11/09/15  9:00 PM  Result Value Ref Range   WBC 8.4 4.0 - 10.5 K/uL   RBC 5.36 4.22 - 5.81 MIL/uL   Hemoglobin 15.3 13.0 - 17.0 g/dL   HCT 44.1 39.0 - 52.0 %   MCV 82.3 78.0 - 100.0 fL   MCH 28.5 26.0 - 34.0 pg   MCHC 34.7 30.0 - 36.0 g/dL    RDW 12.3 11.5 - 15.5 %   Platelets 120 (L) 150 - 400 K/uL   Neutrophils Relative % 65 %   Neutro Abs 5.5 1.7 - 7.7 K/uL   Lymphocytes Relative 24 %   Lymphs Abs 2.0 0.7 - 4.0 K/uL   Monocytes Relative 9 %   Monocytes Absolute 0.7 0.1 - 1.0 K/uL   Eosinophils Relative 2 %   Eosinophils Absolute 0.2 0.0 - 0.7 K/uL   Basophils Relative 0 %   Basophils Absolute 0.0 0.0 - 0.1 K/uL  Type and screen [place]     Status: None   Collection Time: 11/09/15  9:25 PM  Result Value Ref Range   ABO/RH(D) A NEG    Antibody Screen NEG    Sample Expiration 11/12/2015   ABO/Rh     Status: None (Preliminary result)   Collection Time: 11/09/15  9:25 PM  Result Value Ref Range   ABO/RH(D) A NEG   Urinalysis, Routine w reflex microscopic     Status: Abnormal   Collection Time: 11/09/15 10:20 PM  Result Value Ref  Range   Color, Urine AMBER (A) YELLOW    Comment: BIOCHEMICALS MAY BE AFFECTED BY COLOR   APPearance CLEAR CLEAR   Specific Gravity, Urine 1.037 (H) 1.005 - 1.030   pH 6.5 5.0 - 8.0   Glucose, UA 100 (A) NEGATIVE mg/dL   Hgb urine dipstick NEGATIVE NEGATIVE   Bilirubin Urine SMALL (A) NEGATIVE   Ketones, ur 15 (A) NEGATIVE mg/dL   Protein, ur 30 (A) NEGATIVE mg/dL   Nitrite NEGATIVE NEGATIVE   Leukocytes, UA NEGATIVE NEGATIVE  Urine microscopic-add on     Status: Abnormal   Collection Time: 11/09/15 10:20 PM  Result Value Ref Range   Squamous Epithelial / LPF 0-5 (A) NONE SEEN   WBC, UA 0-5 0 - 5 WBC/hpf   RBC / HPF NONE SEEN 0 - 5 RBC/hpf   Bacteria, UA RARE (A) NONE SEEN  CBC     Status: Abnormal   Collection Time: 11/10/15  4:38 AM  Result Value Ref Range   WBC 7.2 4.0 - 10.5 K/uL   RBC 5.02 4.22 - 5.81 MIL/uL   Hemoglobin 14.2 13.0 - 17.0 g/dL   HCT 42.1 39.0 - 52.0 %   MCV 83.9 78.0 - 100.0 fL   MCH 28.3 26.0 - 34.0 pg   MCHC 33.7 30.0 - 36.0 g/dL   RDW 12.5 11.5 - 15.5 %   Platelets 123 (L) 150 - 400 K/uL  Basic metabolic panel     Status: Abnormal    Collection Time: 11/10/15  4:38 AM  Result Value Ref Range   Sodium 137 135 - 145 mmol/L   Potassium 3.9 3.5 - 5.1 mmol/L   Chloride 103 101 - 111 mmol/L   CO2 28 22 - 32 mmol/L   Glucose, Bld 128 (H) 65 - 99 mg/dL   BUN 12 6 - 20 mg/dL   Creatinine, Ser 0.89 0.61 - 1.24 mg/dL   Calcium 8.9 8.9 - 10.3 mg/dL   GFR calc non Af Amer >60 >60 mL/min   GFR calc Af Amer >60 >60 mL/min    Comment: (NOTE) The eGFR has been calculated using the CKD EPI equation. This calculation has not been validated in all clinical situations. eGFR's persistently <60 mL/min signify possible Chronic Kidney Disease.    Anion gap 6 5 - 15    Dg Hip Unilat W Or Wo Pelvis 2-3 Views Left  Result Date: 11/09/2015 CLINICAL DATA:  20 year old male with history of trauma from a fall off of a 4 foot wall landing onto concrete. EXAM: DG HIP (WITH OR WITHOUT PELVIS) 2-3V LEFT COMPARISON:  Left femur radiograph 11/09/2015. FINDINGS: There is a very minimally displaced acute intertrochanteric left hip fracture. No significant angulation. Bony pelvis and right proximal femur as visualized appear intact. IMPRESSION: 1. Acute minimally displaced intertrochanteric fracture of the left hip. Electronically Signed   By: Vinnie Langton M.D.   On: 11/09/2015 22:05   Dg Femur Min 2 Views Left  Result Date: 11/09/2015 CLINICAL DATA:  Pain, stepped off the wall yesterday evening. Unable to bear weight. Technologist notes state pain about the hip. EXAM: LEFT FEMUR 2 VIEWS COMPARISON:  None. FINDINGS: Mildly displaced fracture of the left proximal femur is likely intertrochanteric, however abuts the femoral neck. Distal femur is intact. Femoral head is seated in the acetabulum. IMPRESSION: Mildly displaced left proximal femur fracture, likely intertrochanteric. Recommend dedicated hip radiographs. These results will be called to the ordering clinician or representative by the Radiologist Assistant, and communication documented in  the PACS  or zVision Dashboard. Electronically Signed   By: Jeb Levering M.D.   On: 11/09/2015 19:44    Review of Systems  Constitutional: Negative.   HENT: Negative.        Glasses  Eyes:       Wears glasses  Respiratory: Negative.   Cardiovascular:       Pectus excavatum  Gastrointestinal: Negative.   Genitourinary: Negative.   Musculoskeletal: Negative.   Skin: Negative.    Blood pressure (!) 111/50, pulse (!) 53, temperature 98 F (36.7 C), resp. rate 18, height 6' (1.829 m), weight 60.3 kg (132 lb 15 oz), SpO2 99 %. Physical Exam  Constitutional: He is oriented to person, place, and time. He appears well-developed and well-nourished.  HENT:  Head: Normocephalic and atraumatic.  Eyes: Pupils are equal, round, and reactive to light.  Neck: Normal range of motion.  Cardiovascular: Normal rate and regular rhythm.   Respiratory: Effort normal and breath sounds normal.  GI: Soft. Bowel sounds are normal.  Musculoskeletal:  Pain with left hip ROM  Neurological: He is alert and oriented to person, place, and time.  Skin: Skin is warm and dry.  Psychiatric: Thought content normal.    Assessment/Plan: Left IT hip fx for stabilization surgery post op. Plan Lovenox post op for DVT proph.  Likely discharge home on Tuesday. Discussed surgery , he agrees to proceed.  YATES,MARK C 11/10/2015, 7:19 AM

## 2015-11-10 NOTE — Progress Notes (Signed)
Pt to OR via bed.

## 2015-11-10 NOTE — Interval H&P Note (Signed)
History and Physical Interval Note:  11/10/2015 2:18 PM  Darren Horn  has presented today for surgery, with the diagnosis of LEFT FEMUR FRACTURE - TROCHANTERIC  The various methods of treatment have been discussed with the patient and family. After consideration of risks, benefits and other options for treatment, the patient has consented to  Procedure(s): INTRAMEDULLARY (IM) NAIL INTERTROCHANTRIC (Left) as a surgical intervention .  The patient's history has been reviewed, patient examined, no change in status, stable for surgery.  I have reviewed the patient's chart and labs.  Questions were answered to the patient's satisfaction.     Jaysha Lasure C

## 2015-11-10 NOTE — Transfer of Care (Signed)
Immediate Anesthesia Transfer of Care Note  Patient: Darren Horn  Procedure(s) Performed: Procedure(s): INTRAMEDULLARY (IM) NAIL INTERTROCHANTRIC, LEFT (Left)  Patient Location: PACU  Anesthesia Type:General  Level of Consciousness: awake, alert , oriented, patient cooperative and responds to stimulation  Airway & Oxygen Therapy: Patient Spontanous Breathing and Patient connected to nasal cannula oxygen  Post-op Assessment: Report given to RN, Post -op Vital signs reviewed and stable and Patient moving all extremities X 4  Post vital signs: Reviewed and stable  Last Vitals:  Vitals:   11/10/15 0112 11/10/15 0612  BP: 130/79 (!) 111/50  Pulse: 63 (!) 53  Resp: 18 18  Temp: 36.6 C 36.7 C    Last Pain:  Vitals:   11/10/15 1144  TempSrc:   PainSc: Asleep         Complications: No apparent anesthesia complications

## 2015-11-10 NOTE — Progress Notes (Signed)
Pt ready for OR, report given to Short Stay, parents went to 2nd floor waiting area.

## 2015-11-10 NOTE — Brief Op Note (Signed)
11/09/2015 - 11/10/2015  4:16 PM  PATIENT:  Lenell AntuZachary T Arns  20 y.o. male  PRE-OPERATIVE DIAGNOSIS:  LEFT FEMUR FRACTURE - TROCHANTERIC  POST-OPERATIVE DIAGNOSIS:  LEFT FEMUR FRACTURE - TROCHANTERIC  PROCEDURE:  Procedure(s): INTRAMEDULLARY (IM) NAIL INTERTROCHANTRIC, LEFT (Left)  SURGEON:  Surgeon(s) and Role:    * Eldred MangesMark C Yates, MD - Primary  PHYSICIAN ASSISTANT:   ASSISTANTS: none   ANESTHESIA:   none  EBL:  Total I/O In: 1000 [I.V.:1000] Out: 400 [Urine:400]  BLOOD ADMINISTERED:none  DRAINS: none   LOCAL MEDICATIONS USED:  MARCAINE     SPECIMEN:  No Specimen  DISPOSITION OF SPECIMEN:  N/A  COUNTS:  YES  TOURNIQUET:  * No tourniquets in log *  DICTATION: .Other Dictation: Dictation Number [id number]  PLAN OF CARE: Other Dictation: Dictation Number [id number]  PATIENT DISPOSITION:  PACU - hemodynamically stable.   Delay start of Pharmacological VTE agent (>24hrs) due to surgical blood loss or risk of bleeding: yes

## 2015-11-10 NOTE — Anesthesia Postprocedure Evaluation (Signed)
Anesthesia Post Note  Patient: Darren Horn  Procedure(s) Performed: Procedure(s) (LRB): INTRAMEDULLARY (IM) NAIL INTERTROCHANTRIC, LEFT (Left)  Patient location during evaluation: PACU Anesthesia Type: General Level of consciousness: awake and alert Pain management: pain level controlled Vital Signs Assessment: post-procedure vital signs reviewed and stable Respiratory status: spontaneous breathing, nonlabored ventilation, respiratory function stable and patient connected to nasal cannula oxygen Cardiovascular status: blood pressure returned to baseline and stable Postop Assessment: no signs of nausea or vomiting Anesthetic complications: no    Last Vitals:  Vitals:   11/10/15 0612 11/10/15 1630  BP: (!) 111/50 140/84  Pulse: (!) 53 68  Resp: 18 16  Temp: 36.7 C 36.3 C    Last Pain:  Vitals:   11/10/15 1144  TempSrc:   PainSc: Asleep                 Kier Smead S

## 2015-11-10 NOTE — Anesthesia Procedure Notes (Signed)
Procedure Name: Intubation Date/Time: 11/10/2015 3:26 PM Performed by: Melina SchoolsBANKS, Colin Ellers J Pre-anesthesia Checklist: Patient identified, Emergency Drugs available, Suction available, Patient being monitored and Timeout performed Patient Re-evaluated:Patient Re-evaluated prior to inductionOxygen Delivery Method: Circle system utilized Preoxygenation: Pre-oxygenation with 100% oxygen Intubation Type: IV induction and Cricoid Pressure applied Ventilation: Mask ventilation without difficulty Laryngoscope Size: Mac and 3 Grade View: Grade III Tube type: Oral Tube size: 7.5 mm Number of attempts: 1 Airway Equipment and Method: Stylet Placement Confirmation: ETT inserted through vocal cords under direct vision,  positive ETCO2 and breath sounds checked- equal and bilateral Secured at: 23 cm Tube secured with: Tape Dental Injury: Teeth and Oropharynx as per pre-operative assessment

## 2015-11-10 NOTE — Progress Notes (Signed)
PROGRESS NOTE    Darren Horn  HYQ:[id number] DOB: 19-Nov-1995 DOA: 11/09/2015 PCP: No PCP Per Patient    Brief Narrative:  Darren Horn is a 20 y.o. male with no significant past medical history who presents with hip pain after a fall   Assessment & Plan:   Principal Problem:   Closed intertrochanteric fracture of left hip (HCC)   Left intertrochanteric fracture of the left hip: S/p IM nail placement and repair by Dr Ophelia Charter on 8/6.  Pain control an d muscle relaxants as needed.  PT eval in am.    Hypokalemia: repleted as needed.   DVT prophylaxis: (Lovenox/) Code Status: (Full) Family Communication: none a t bedside.  Disposition Plan: pending PT eval.    Consultants:   Orthopedics.    Procedures: s/p intramedullary nail placement.    Antimicrobials: none    Subjective: Some muscle spams . Discussed with RN about giving him a muscle relaxant.   Objective: Vitals:   11/10/15 1630 11/10/15 1645 11/10/15 1700 11/10/15 1803  BP: 140/84 (!) 142/80 140/82 134/84  Pulse: 68 65 62 71  Resp: 16 18 (!) 22 17  Temp: 97.3 F (36.3 C)   98.5 F (36.9 C)  TempSrc:    Oral  SpO2: 100% 100% 100% 99%  Weight:      Height:        Intake/Output Summary (Last 24 hours) at 11/10/15 1855 Last data filed at 11/10/15 1623  Gross per 24 hour  Intake             1700 ml  Output              450 ml  Net             1250 ml   Filed Weights   11/10/15 0112  Weight: 60.3 kg (132 lb 15 oz)    Examination:  General exam: Appears calm and comfortable  Respiratory system: Clear to auscultation. Respiratory effort normal. No wheezing or rhonchi.  Cardiovascular system: S1 & S2 heard, RRR. No JVD, murmurs, rubs, gallops or clicks. No pedal edema. Gastrointestinal system: Abdomen is nondistended, soft and nontender. No organomegaly or masses felt. Normal bowel sounds heard. Central nervous system: Alert and oriented. No focal neurological deficits. Extremities:left  lower extremity painful rom. Mild tenderness on the left hip.  Skin: No rashes, lesions or ulcers Psychiatry: Judgement and insight appear normal. Mood & affect appropriate.     Data Reviewed: I have personally reviewed following labs and imaging studies  CBC:  Recent Labs Lab 11/09/15 2100 11/10/15 0438  WBC 8.4 7.2  NEUTROABS 5.5  --   HGB 15.3 14.2  HCT 44.1 42.1  MCV 82.3 83.9  PLT 120* 123*   Basic Metabolic Panel:  Recent Labs Lab 11/09/15 2100 11/10/15 0438  NA 136 137  K 3.4* 3.9  CL 103 103  CO2 26 28  GLUCOSE 98 128*  BUN 12 12  CREATININE 0.77 0.89  CALCIUM 9.1 8.9   GFR: Estimated Creatinine Clearance: 112.9 mL/min (by C-G formula based on SCr of 0.89 mg/dL). Liver Function Tests: No results for input(s): AST, ALT, ALKPHOS, BILITOT, PROT, ALBUMIN in the last 168 hours. No results for input(s): LIPASE, AMYLASE in the last 168 hours. No results for input(s): AMMONIA in the last 168 hours. Coagulation Profile: No results for input(s): INR, PROTIME in the last 168 hours. Cardiac Enzymes: No results for input(s): CKTOTAL, CKMB, CKMBINDEX, TROPONINI in the last 168 hours. BNP (last  3 results) No results for input(s): PROBNP in the last 8760 hours. HbA1C: No results for input(s): HGBA1C in the last 72 hours. CBG: No results for input(s): GLUCAP in the last 168 hours. Lipid Profile: No results for input(s): CHOL, HDL, LDLCALC, TRIG, CHOLHDL, LDLDIRECT in the last 72 hours. Thyroid Function Tests: No results for input(s): TSH, T4TOTAL, FREET4, T3FREE, THYROIDAB in the last 72 hours. Anemia Panel: No results for input(s): VITAMINB12, FOLATE, FERRITIN, TIBC, IRON, RETICCTPCT in the last 72 hours. Sepsis Labs: No results for input(s): PROCALCITON, LATICACIDVEN in the last 168 hours.  Recent Results (from the past 240 hour(s))  Surgical pcr screen     Status: Abnormal   Collection Time: 11/10/15 11:22 AM  Result Value Ref Range Status   MRSA, PCR  NEGATIVE NEGATIVE Final   Staphylococcus aureus POSITIVE (A) NEGATIVE Final    Comment:        The Xpert SA Assay (FDA approved for NASAL specimens in patients over 69 years of age), is one component of a comprehensive surveillance program.  Test performance has been validated by [facility] for patients greater than or equal to 67 year old. It is not intended to diagnose infection nor to guide or monitor treatment.          Radiology Studies: Dg C-arm 1-60 Min  Result Date: 11/10/2015 CLINICAL DATA:  Left intratrochanteric hip fracture EXAM: DG C-ARM 61-120 MIN; OPERATIVE LEFT HIP WITH PELVIS COMPARISON:  11/09/2015 FLUOROSCOPY TIME:  Radiation Exposure Index (as provided by the fluoroscopic device): Not available If the device does not provide the exposure index: Fluoroscopy Time:  46.8 seconds Number of Acquired Images:  4 FINDINGS: Medullary rod is now noted in the proximal femur with fixation screw traversing the femoral neck. Fracture fragments are in near anatomic alignment. No soft tissue abnormality is noted. IMPRESSION: Status post ORIF of left femoral fracture. Electronically Signed   By: Alcide Clever M.D.   On: 11/10/2015 16:26   Dg Hip Operative Unilat W Or W/o Pelvis Left  Result Date: 11/10/2015 CLINICAL DATA:  Left intratrochanteric hip fracture EXAM: DG C-ARM 61-120 MIN; OPERATIVE LEFT HIP WITH PELVIS COMPARISON:  11/09/2015 FLUOROSCOPY TIME:  Radiation Exposure Index (as provided by the fluoroscopic device): Not available If the device does not provide the exposure index: Fluoroscopy Time:  46.8 seconds Number of Acquired Images:  4 FINDINGS: Medullary rod is now noted in the proximal femur with fixation screw traversing the femoral neck. Fracture fragments are in near anatomic alignment. No soft tissue abnormality is noted. IMPRESSION: Status post ORIF of left femoral fracture. Electronically Signed   By: Alcide Clever M.D.   On: 11/10/2015 16:26   Dg Hip Unilat W Or Wo  Pelvis 2-3 Views Left  Result Date: 11/09/2015 CLINICAL DATA:  20 year old male with history of trauma from a fall off of a 4 foot wall landing onto concrete. EXAM: DG HIP (WITH OR WITHOUT PELVIS) 2-3V LEFT COMPARISON:  Left femur radiograph 11/09/2015. FINDINGS: There is a very minimally displaced acute intertrochanteric left hip fracture. No significant angulation. Bony pelvis and right proximal femur as visualized appear intact. IMPRESSION: 1. Acute minimally displaced intertrochanteric fracture of the left hip. Electronically Signed   By: Trudie Reed M.D.   On: 11/09/2015 22:05   Dg Femur Min 2 Views Left  Result Date: 11/09/2015 CLINICAL DATA:  Pain, stepped off the wall yesterday evening. Unable to bear weight. Technologist notes state pain about the hip. EXAM: LEFT FEMUR 2 VIEWS COMPARISON:  None. FINDINGS: Mildly displaced fracture of the left proximal femur is likely intertrochanteric, however abuts the femoral neck. Distal femur is intact. Femoral head is seated in the acetabulum. IMPRESSION: Mildly displaced left proximal femur fracture, likely intertrochanteric. Recommend dedicated hip radiographs. These results will be called to the ordering clinician or representative by the Radiologist Assistant, and communication documented in the PACS or zVision Dashboard. Electronically Signed   By: Rubye OaksMelanie  Ehinger M.D.   On: 11/09/2015 19:44        Scheduled Meds: . [START ON 11/11/2015] aspirin EC  325 mg Oral Q breakfast  . Chlorhexidine Gluconate Cloth  6 each Topical Daily  . docusate sodium  100 mg Oral BID  . HYDROmorphone      . HYDROmorphone      . mupirocin ointment  1 application Nasal BID   Continuous Infusions: . sodium chloride    . 0.9 % NaCl with KCl 20 mEq / L 100 mL/hr at 11/10/15 0306     LOS: 1 day    Time spent: 30 minutes.     Kathlen ModyAKULA,Ambrie Carte, MD [facility] Pager [phone] If 7PM-7AM, please contact night-coverage [email or web address] Password  [email or web address] 11/10/2015, 6:55 PM

## 2015-11-10 NOTE — H&P (View-Only) (Signed)
Reason for Consult:fall with left IT hip Fx Referring Physician: Akula MD  Darren Horn is an 19 y.o. male.  HPI: 19 yo lives with his parents does not work, was at [place] and fell off 4 ft wall with left hip pain. Some pain with WB following morning he could not walk.   xrays at urgent care left IT hip Fx  Past Medical History:  Diagnosis Date  . Allergy   . Pectus excavatum     History reviewed. No pertinent surgical history.  Family History  Problem Relation Age of Onset  . Pectus excavatum Brother   . Asthma Father   . Cancer Maternal Grandmother     Social History:  reports that he has never smoked. He has never used smokeless tobacco. He reports that he does not drink alcohol or use drugs.  Allergies: No Known Allergies  Medications: I have reviewed the patient's current medications.  Results for orders placed or performed during the hospital encounter of 11/09/15 (from the past 48 hour(s))  Basic metabolic panel     Status: Abnormal   Collection Time: 11/09/15  9:00 PM  Result Value Ref Range   Sodium 136 135 - 145 mmol/L   Potassium 3.4 (L) 3.5 - 5.1 mmol/L   Chloride 103 101 - 111 mmol/L   CO2 26 22 - 32 mmol/L   Glucose, Bld 98 65 - 99 mg/dL   BUN 12 6 - 20 mg/dL   Creatinine, Ser 0.77 0.61 - 1.24 mg/dL   Calcium 9.1 8.9 - 10.3 mg/dL   GFR calc non Af Amer >60 >60 mL/min   GFR calc Af Amer >60 >60 mL/min    Comment: (NOTE) The eGFR has been calculated using the CKD EPI equation. This calculation has not been validated in all clinical situations. eGFR's persistently <60 mL/min signify possible Chronic Kidney Disease.    Anion gap 7 5 - 15  CBC with Differential     Status: Abnormal   Collection Time: 11/09/15  9:00 PM  Result Value Ref Range   WBC 8.4 4.0 - 10.5 K/uL   RBC 5.36 4.22 - 5.81 MIL/uL   Hemoglobin 15.3 13.0 - 17.0 g/dL   HCT 44.1 39.0 - 52.0 %   MCV 82.3 78.0 - 100.0 fL   MCH 28.5 26.0 - 34.0 pg   MCHC 34.7 30.0 - 36.0 g/dL    RDW 12.3 11.5 - 15.5 %   Platelets 120 (L) 150 - 400 K/uL   Neutrophils Relative % 65 %   Neutro Abs 5.5 1.7 - 7.7 K/uL   Lymphocytes Relative 24 %   Lymphs Abs 2.0 0.7 - 4.0 K/uL   Monocytes Relative 9 %   Monocytes Absolute 0.7 0.1 - 1.0 K/uL   Eosinophils Relative 2 %   Eosinophils Absolute 0.2 0.0 - 0.7 K/uL   Basophils Relative 0 %   Basophils Absolute 0.0 0.0 - 0.1 K/uL  Type and screen [facility]     Status: None   Collection Time: 11/09/15  9:25 PM  Result Value Ref Range   ABO/RH(D) A NEG    Antibody Screen NEG    Sample Expiration 11/12/2015   ABO/Rh     Status: None (Preliminary result)   Collection Time: 11/09/15  9:25 PM  Result Value Ref Range   ABO/RH(D) A NEG   Urinalysis, Routine w reflex microscopic     Status: Abnormal   Collection Time: 11/09/15 10:20 PM  Result Value Ref   Range   Color, Urine AMBER (A) YELLOW    Comment: BIOCHEMICALS MAY BE AFFECTED BY COLOR   APPearance CLEAR CLEAR   Specific Gravity, Urine 1.037 (H) 1.005 - 1.030   pH 6.5 5.0 - 8.0   Glucose, UA 100 (A) NEGATIVE mg/dL   Hgb urine dipstick NEGATIVE NEGATIVE   Bilirubin Urine SMALL (A) NEGATIVE   Ketones, ur 15 (A) NEGATIVE mg/dL   Protein, ur 30 (A) NEGATIVE mg/dL   Nitrite NEGATIVE NEGATIVE   Leukocytes, UA NEGATIVE NEGATIVE  Urine microscopic-add on     Status: Abnormal   Collection Time: 11/09/15 10:20 PM  Result Value Ref Range   Squamous Epithelial / LPF 0-5 (A) NONE SEEN   WBC, UA 0-5 0 - 5 WBC/hpf   RBC / HPF NONE SEEN 0 - 5 RBC/hpf   Bacteria, UA RARE (A) NONE SEEN  CBC     Status: Abnormal   Collection Time: 11/10/15  4:38 AM  Result Value Ref Range   WBC 7.2 4.0 - 10.5 K/uL   RBC 5.02 4.22 - 5.81 MIL/uL   Hemoglobin 14.2 13.0 - 17.0 g/dL   HCT 42.1 39.0 - 52.0 %   MCV 83.9 78.0 - 100.0 fL   MCH 28.3 26.0 - 34.0 pg   MCHC 33.7 30.0 - 36.0 g/dL   RDW 12.5 11.5 - 15.5 %   Platelets 123 (L) 150 - 400 K/uL  Basic metabolic panel     Status: Abnormal    Collection Time: 11/10/15  4:38 AM  Result Value Ref Range   Sodium 137 135 - 145 mmol/L   Potassium 3.9 3.5 - 5.1 mmol/L   Chloride 103 101 - 111 mmol/L   CO2 28 22 - 32 mmol/L   Glucose, Bld 128 (H) 65 - 99 mg/dL   BUN 12 6 - 20 mg/dL   Creatinine, Ser 0.89 0.61 - 1.24 mg/dL   Calcium 8.9 8.9 - 10.3 mg/dL   GFR calc non Af Amer >60 >60 mL/min   GFR calc Af Amer >60 >60 mL/min    Comment: (NOTE) The eGFR has been calculated using the CKD EPI equation. This calculation has not been validated in all clinical situations. eGFR's persistently <60 mL/min signify possible Chronic Kidney Disease.    Anion gap 6 5 - 15    Dg Hip Unilat W Or Wo Pelvis 2-3 Views Left  Result Date: 11/09/2015 CLINICAL DATA:  19-year-old male with history of trauma from a fall off of a 4 foot wall landing onto concrete. EXAM: DG HIP (WITH OR WITHOUT PELVIS) 2-3V LEFT COMPARISON:  Left femur radiograph 11/09/2015. FINDINGS: There is a very minimally displaced acute intertrochanteric left hip fracture. No significant angulation. Bony pelvis and right proximal femur as visualized appear intact. IMPRESSION: 1. Acute minimally displaced intertrochanteric fracture of the left hip. Electronically Signed   By: Daniel  Entrikin M.D.   On: 11/09/2015 22:05   Dg Femur Min 2 Views Left  Result Date: 11/09/2015 CLINICAL DATA:  Pain, stepped off the wall yesterday evening. Unable to bear weight. Technologist notes state pain about the hip. EXAM: LEFT FEMUR 2 VIEWS COMPARISON:  None. FINDINGS: Mildly displaced fracture of the left proximal femur is likely intertrochanteric, however abuts the femoral neck. Distal femur is intact. Femoral head is seated in the acetabulum. IMPRESSION: Mildly displaced left proximal femur fracture, likely intertrochanteric. Recommend dedicated hip radiographs. These results will be called to the ordering clinician or representative by the Radiologist Assistant, and communication documented in   the PACS  or zVision Dashboard. Electronically Signed   By: Melanie  Ehinger M.D.   On: 11/09/2015 19:44    Review of Systems  Constitutional: Negative.   HENT: Negative.        Glasses  Eyes:       Wears glasses  Respiratory: Negative.   Cardiovascular:       Pectus excavatum  Gastrointestinal: Negative.   Genitourinary: Negative.   Musculoskeletal: Negative.   Skin: Negative.    Blood pressure (!) 111/50, pulse (!) 53, temperature 98 F (36.7 C), resp. rate 18, height 6' (1.829 m), weight 60.3 kg (132 lb 15 oz), SpO2 99 %. Physical Exam  Constitutional: He is oriented to person, place, and time. He appears well-developed and well-nourished.  HENT:  Head: Normocephalic and atraumatic.  Eyes: Pupils are equal, round, and reactive to light.  Neck: Normal range of motion.  Cardiovascular: Normal rate and regular rhythm.   Respiratory: Effort normal and breath sounds normal.  GI: Soft. Bowel sounds are normal.  Musculoskeletal:  Pain with left hip ROM  Neurological: He is alert and oriented to person, place, and time.  Skin: Skin is warm and dry.  Psychiatric: Thought content normal.    Assessment/Plan: Left IT hip fx for stabilization surgery post op. Plan Lovenox post op for DVT proph.  Likely discharge home on Tuesday. Discussed surgery , he agrees to proceed.  Cyani Kallstrom C 11/10/2015, 7:19 AM     

## 2015-11-11 ENCOUNTER — Encounter (HOSPITAL_COMMUNITY): Payer: Self-pay | Admitting: Orthopaedic Surgery

## 2015-11-11 LAB — BASIC METABOLIC PANEL
Anion gap: 8 (ref 5–15)
BUN: 7 mg/dL (ref 6–20)
CALCIUM: 9 mg/dL (ref 8.9–10.3)
CO2: 28 mmol/L (ref 22–32)
CREATININE: 0.84 mg/dL (ref 0.61–1.24)
Chloride: 98 mmol/L — ABNORMAL LOW (ref 101–111)
GFR calc Af Amer: >60 mL/min (ref >60)
GFR calc non Af Amer: >60 mL/min (ref >60)
GLUCOSE: 149 mg/dL — AB (ref 65–99)
Potassium: 4.2 mmol/L (ref 3.5–5.1)
Sodium: 134 mmol/L — ABNORMAL LOW (ref 135–145)

## 2015-11-11 LAB — CBC
HEMATOCRIT: 39.4 % (ref 39.0–52.0)
Hemoglobin: 13.8 g/dL (ref 13.0–17.0)
MCH: 29.1 pg (ref 26.0–34.0)
MCHC: 35 g/dL (ref 30.0–36.0)
MCV: 82.9 fL (ref 78.0–100.0)
Platelets: 131 10*3/uL — ABNORMAL LOW (ref 150–400)
RBC: 4.75 MIL/uL (ref 4.22–5.81)
RDW: 12.3 % (ref 11.5–15.5)
WBC: 8 10*3/uL (ref 4.0–10.5)

## 2015-11-11 LAB — VITAMIN D 25 HYDROXY (VIT D DEFICIENCY, FRACTURES): VIT D 25 HYDROXY: 45.6 ng/mL (ref 30.0–100.0)

## 2015-11-11 MED ORDER — MORPHINE SULFATE (PF) 2 MG/ML IV SOLN
1.0000 mg | INTRAVENOUS | Status: DC | PRN
  Administered 2015-11-11: 2 mg via INTRAVENOUS
  Filled 2015-11-11: qty 1

## 2015-11-11 NOTE — Evaluation (Signed)
Physical Therapy Evaluation Patient Details Name: Darren Horn MRN: [id number] DOB: [birth year] Today's Date: 11/11/2015   History of Present Illness  20 [facility]. [facility] s/p 3 ft fall onto concrete sustaining a L femur fx.  Pt s/p IM Nail on 11/10/15.  Pt with significant PMhx of pectus excavatum.    Clinical Impression  Pt having difficulty moving due to pain, spasm.  He is very guarded and stiff throughout movement, even after LE exercises and being pre medicated.  He needs reminders to breathe throughout session.  Family is very supportive and going to provide 24/7 care.  Pt needs at least one more session if not maybe two to progress mobility before d/c home.   PT to follow acutely for deficits listed below.       Follow Up Recommendations Home health PT;Supervision for mobility/OOB    Equipment Recommendations  Rolling walker with 5" wheels    Recommendations for Other Services   NA    Precautions / Restrictions Precautions Precautions: Fall Precaution Comments: due to pain and stiffness Restrictions Weight Bearing Restrictions: Yes LLE Weight Bearing: Weight bearing as tolerated      Mobility  Bed Mobility Overal bed mobility: Needs Assistance Bed Mobility: Supine to Sit     Supine to sit: Max assist;HOB elevated (use of chuck pad, pt pushing posteriorly) Sit to supine: Min assist   General bed mobility comments: Min assist to help lift left leg very slowly into the bed.  Pt holding it stiffly and moving very slowly.   Transfers Overall transfer level: Needs assistance Equipment used: Rolling walker (2 wheeled) Transfers: Sit to/from Stand Sit to Stand: Max assist;From elevated surface Stand pivot transfers: +2 safety/equipment;Min assist       General transfer comment: Two person used for safety when getting up out of low recliner chair and to pivot to the bed.  Pt very stiff and needs cues to breathe throughout session.    Ambulation/Gait Ambulation/Gait assistance: Min assist Ambulation Distance (Feet): 3 Feet Assistive device: Rolling walker (2 wheeled) Gait Pattern/deviations: Step-to pattern;Antalgic Gait velocity: decreased   General Gait Details: Pt with moderately antalgic gait pattern able to support himself well with bil UEs (very strong).  Verbal cues to reinforce that it is ok to put weight through his arms.          Balance Overall balance assessment: Needs assistance Sitting-balance support: Bilateral upper extremity supported;Feet supported Sitting balance-Leahy Scale: Poor Sitting balance - Comments: reliant on UE A   Standing balance support: Bilateral upper extremity supported Standing balance-Leahy Scale: Poor Standing balance comment: reliant on RW and increased A                             Pertinent Vitals/Pain Pain Assessment: 0-10 Pain Score: 8  Pain Location: left leg left leg Pain Descriptors / Indicators: Grimacing;Guarding;Cramping Pain Intervention(s): Limited activity within patient's tolerance;Premedicated before session;Monitored during session;Repositioned;Ice applied    Home Living Family/patient expects to be discharged to:: Private residence Living Arrangements: Parent Available Help at Discharge: Family;Friend(s) (they are working on 24/7 help at home for first few days) Type of Home: House Home Access: Stairs to enter Entrance Stairs-Rails: None Entrance Stairs-Number of Steps: 4 Home Layout: One level (usually lives downstairs, but will stay on main level for now sleeping in recliner or sectional sofa) Home Equipment: None      Prior Function Level of Independence: Independent  Comments: does not work     Higher education careers adviser   Dominant Hand: Right    Extremity/Trunk Assessment   Upper Extremity Assessment: Defer to OT evaluation           Lower Extremity Assessment: LLE deficits/detail   LLE Deficits / Details:  left leg very stiff and guarded throughout session, even after exercises.  Pt with cramping in this leg, but I think it is due to his inability to fully related, he is holding his muscles tense, even at rest.  Ankle 3-/5, knee 2/5, hip 2-/5  Cervical / Trunk Assessment: Other exceptions  Communication   Communication: No difficulties  Cognition Arousal/Alertness: Awake/alert Behavior During Therapy: WFL for tasks assessed/performed Overall Cognitive Status: Within Functional Limits for tasks assessed (possible he has some learning disabil, not relayed by father)                         Exercises Total Joint Exercises Ankle Circles/Pumps: AROM;Both;20 reps Quad Sets: AROM;Left;10 reps Heel Slides: AAROM;Left;10 reps Hip ABduction/ADduction: AAROM;Left;10 reps      Assessment/Plan    PT Assessment Patient needs continued PT services  PT Diagnosis Difficulty walking;Abnormality of gait;Generalized weakness;Acute pain   PT Problem List Decreased strength;Decreased range of motion;Decreased activity tolerance;Decreased balance;Decreased mobility;Decreased knowledge of use of DME;Pain  PT Treatment Interventions DME instruction;Gait training;Stair training;Therapeutic activities;Functional mobility training;Therapeutic exercise;Balance training;Patient/family education;Manual techniques;Modalities   PT Goals (Current goals can be found in the Care Plan section) Acute Rehab PT Goals Patient Stated Goal: to decrease pain, get back to walking PT Goal Formulation: With patient/family Time For Goal Achievement: 11/18/15 Potential to Achieve Goals: Good    Frequency Min 5X/week           End of Session Equipment Utilized During Treatment: Gait belt Activity Tolerance: Patient limited by pain Patient left: in bed;with call bell/phone within reach;with family/visitor present           Time: 1610-9604 PT Time Calculation (min) (ACUTE ONLY): 31 min   Charges:   PT  Evaluation $PT Eval Low Complexity: 1 Procedure PT Treatments $Therapeutic Activity: 8-22 mins        Darren Horn B. Darren Horn, PT, DPT [phone]   11/11/2015, 3:50 PM

## 2015-11-11 NOTE — Evaluation (Signed)
Occupational Therapy Evaluation Patient Details Name: Darren Horn MRN: [id number] DOB: 1996-03-06 Today's Date: 11/11/2015    History of Present Illness 20 y.o. male [facility]/15 s/p 3 ft fall onto concrete sustaining a L femur fx.  Pt s/p IM Nail on 11/10/15.  Pt with significant PMhx of pectus excavatum.     Clinical Impression   This 20 yo male admitted and underwent above presents to acute OT with deficits below (see OT problem list) thus affecting his PLOF to Independent with basic ADLs. He will benefit from continued OT on acute and follow up HHOT.    Follow Up Recommendations  Home health OT    Equipment Recommendations  3 in 1 bedside comode;Tub/shower bench;Other (comment) (dad aware tub bench is out of pocket expense)       Precautions / Restrictions Precautions Precautions: Fall Precaution Comments: due to pain and stiffness Restrictions Weight Bearing Restrictions: Yes LLE Weight Bearing: Weight bearing as tolerated      Mobility Bed Mobility Overal bed mobility: Needs Assistance Bed Mobility: Supine to Sit     Supine to sit: Max assist;HOB elevated (use of chuck pad, pt pushing posteriorly)    Transfers Overall transfer level: Needs assistance Equipment used: Rolling walker (2 wheeled) Transfers: Sit to/from Stand Sit to Stand: Max assist;From elevated surface Stand pivot transfers: Min assist          Balance Overall balance assessment: Needs assistance Sitting-balance support: Bilateral upper extremity supported;Feet supported Sitting balance-Leahy Scale: Poor Sitting balance - Comments: reliant on UE A   Standing balance support: Bilateral upper extremity supported Standing balance-Leahy Scale: Poor Standing balance comment: reliant on RW and increased A                            ADL Overall ADL's : Needs assistance/impaired Eating/Feeding: Independent (supported sitting)   Grooming: Set up;Supervision/safety  (supported sitting)   Upper Body Bathing: Set up;Supervision/ safety (supported sitting)   Lower Body Bathing: Total assistance (with Max A from raised surface sit<>stand)   Upper Body Dressing : Set up;Supervision/safety (supported sitting)   Lower Body Dressing: Total assistance (with Max sit<>stand from elevated surface)   Toilet Transfer:  (Max A sit<>stand with min A stand turn with RW)   Toileting- Clothing Manipulation and Hygiene: Total assistance (max A sit<>stand from elevated surface)         General ADL Comments: Spoke with pt's Dad and made him aware of tub bench that would benefit pt and he realizes that it is an out of pocket expense               Pertinent Vitals/Pain Pain Assessment: 0-10 Pain Score: 8  Pain Location: left leg left leg Pain Descriptors / Indicators: Grimacing;Guarding;Cramping Pain Intervention(s): Limited activity within patient's tolerance;Premedicated before session;Monitored during session;Repositioned;Ice applied     Hand Dominance Right   Extremity/Trunk Assessment Upper Extremity Assessment Upper Extremity Assessment: Defer to OT evaluation     Cervical / Trunk Assessment Cervical / Trunk Assessment: Other exceptions Cervical / Trunk Exceptions: pectoralis excavatum   Communication Communication Communication: No difficulties   Cognition Arousal/Alertness: Awake/alert Behavior During Therapy: WFL for tasks assessed/performed Overall Cognitive Status: Within Functional Limits for tasks assessed (possible he has some learning disabil, not relayed by father)  Home Living Family/patient expects to be discharged to:: Private residence Living Arrangements: Parent Available Help at Discharge: Family;Friend(s) (they are working on 24/7 help at home for first few days) Type of Home: House Home Access: Stairs to enter Secretary/administrator of Steps: 4 Entrance Stairs-Rails: None Home  Layout: One level (usually lives downstairs, but will stay on main level for now sleeping in recliner or sectional sofa)     Bathroom Shower/Tub: Tub/shower unit;Curtain (upstairs) Shower/tub characteristics: Engineer, building services: Standard     Home Equipment: None          Prior Functioning/Environment Level of Independence: Independent        Comments: does not work    OT Diagnosis: Generalized weakness;Acute pain (? cognitive deficits--slow to respond)   OT Problem List: Decreased strength;Decreased range of motion;Impaired balance (sitting and/or standing);Decreased safety awareness;Decreased knowledge of use of DME or AE;Pain   OT Treatment/Interventions: Self-care/ADL training;Balance training;DME and/or AE instruction;Therapeutic activities;Patient/family education    OT Goals(Current goals can be found in the care plan section) Acute Rehab OT Goals Patient Stated Goal: to be able to walk again OT Goal Formulation: With patient/family Time For Goal Achievement: 11/18/15 Potential to Achieve Goals: Good  OT Frequency: Min 3X/week              End of Session Equipment Utilized During Treatment: Gait belt;Rolling walker Nurse Communication: Mobility status  Activity Tolerance: Patient tolerated treatment well Patient left: in chair;with call bell/phone within reach;with family/visitor present   Time: 1256-1340 OT Time Calculation (min): 44 min Charges:  OT General Charges $OT Visit: 1 Procedure OT Evaluation $OT Eval Moderate Complexity: 1 Procedure OT Treatments $Self Care/Home Management : 23-37 mins  Evette Georges [phone] 11/11/2015, 4:00 PM

## 2015-11-11 NOTE — Progress Notes (Signed)
Subjective: Doing well.  Pain controlled.  Some nausea.     Objective: Vital signs in last 24 hours: Temp:  [97.3 F (36.3 C)-99.1 F (37.3 C)] 98.6 F (37 C) (08/07 1001) Pulse Rate:  [62-79] 79 (08/07 1001) Resp:  [16-22] 17 (08/07 1001) BP: (125-150)/(69-84) 125/79 (08/07 1001) SpO2:  [96 %-100 %] 97 % (08/07 1001)  Intake/Output from previous day: 08/06 0701 - 08/07 0700 In: 1700 [I.V.:1700] Out: 2570 [Urine:2520; Blood:50] Intake/Output this shift: Total I/O In: 300 [P.O.:300] Out: 1325 [Urine:1325]   Recent Labs  11/09/15 2100 11/10/15 0438 11/11/15 0335  HGB 15.3 14.2 13.8    Recent Labs  11/10/15 0438 11/11/15 0335  WBC 7.2 8.0  RBC 5.02 4.75  HCT 42.1 39.4  PLT 123* 131*    Recent Labs  11/10/15 0438 11/11/15 0335  NA 137 134*  K 3.9 4.2  CL 103 98*  CO2 28 28  BUN 12 7  CREATININE 0.89 0.84  GLUCOSE 128* 149*  CALCIUM 8.9 9.0   No results for input(s): LABPT, INR in the last 72 hours.  Exam:  Wounds look good.  Staples intact.  Calf nontender, NVI.   Assessment/Plan: PT today.  Anticipate d/c home tomorrow.  Dressing change.    Darren Horn 11/11/2015, 10:36 AM

## 2015-11-11 NOTE — Progress Notes (Signed)
PROGRESS NOTE    Lenell AntuZachary T Grimes  UEA:[id number] DOB: 06/14/1995 DOA: 11/09/2015 PCP: No PCP Per Patient    Brief Narrative:  Lenell AntuZachary T Alarid is a 20 y.o. male with no significant past medical history who presents with hip pain after a fall   Assessment & Plan:   Principal Problem:   Closed intertrochanteric fracture of left hip (HCC)   Left intertrochanteric fracture of the left hip: S/p IM nail placement and repair by Dr Ophelia CharterYates on 8/6.  Pain control an d muscle relaxants as needed.  As his pain not well controlled, added morphine.    Hypokalemia: repleted as needed.   DVT prophylaxis: (Lovenox/) Code Status: (Full) Family Communication: none at bedside.  Disposition Plan: pending PT eval.    Consultants:   Orthopedics.    Procedures: s/p intramedullary nail placement.    Antimicrobials: none    Subjective: Pain well controlled.   Objective: Vitals:   11/11/15 0548 11/11/15 1001 11/11/15 1423 11/11/15 1518  BP: 125/69 125/79 129/83   Pulse: 66 79 76   Resp: 17 17 17    Temp: 99.1 F (37.3 C) 98.6 F (37 C) 98.4 F (36.9 C)   TempSrc:  Oral Oral   SpO2: 96% 97% 97%   Weight:    59.9 kg (132 lb)  Height:    5\' 7"  (1.702 m)    Intake/Output Summary (Last 24 hours) at 11/11/15 1731 Last data filed at 11/11/15 1653  Gross per 24 hour  Intake              300 ml  Output             4095 ml  Net            -3795 ml   Filed Weights   11/10/15 0112 11/11/15 1518  Weight: 60.3 kg (132 lb 15 oz) 59.9 kg (132 lb)    Examination:  General exam: in mild distress from pain.  Respiratory system: Clear to auscultation. Respiratory effort normal. No wheezing or rhonchi.  Cardiovascular system: S1 & S2 heard, RRR. No JVD, murmurs, rubs, gallops or clicks. No pedal edema. Gastrointestinal system: Abdomen is nondistended, soft and nontender. No organomegaly or masses felt. Normal bowel sounds heard. Central nervous system: Alert and oriented. No focal  neurological deficits. Extremities:left lower extremity painful rom. Mild tenderness on the left hip.  Skin: No rashes, lesions or ulcers Psychiatry: Judgement and insight appear normal. Mood & affect appropriate.     Data Reviewed: I have personally reviewed following labs and imaging studies  CBC:  Recent Labs Lab 11/09/15 2100 11/10/15 0438 11/11/15 0335  WBC 8.4 7.2 8.0  NEUTROABS 5.5  --   --   HGB 15.3 14.2 13.8  HCT 44.1 42.1 39.4  MCV 82.3 83.9 82.9  PLT 120* 123* 131*   Basic Metabolic Panel:  Recent Labs Lab 11/09/15 2100 11/10/15 0438 11/11/15 0335  NA 136 137 134*  K 3.4* 3.9 4.2  CL 103 103 98*  CO2 26 28 28   GLUCOSE 98 128* 149*  BUN 12 12 7   CREATININE 0.77 0.89 0.84  CALCIUM 9.1 8.9 9.0   GFR: Estimated Creatinine Clearance: 118.8 mL/min (by C-G formula based on SCr of 0.84 mg/dL). Liver Function Tests: No results for input(s): AST, ALT, ALKPHOS, BILITOT, PROT, ALBUMIN in the last 168 hours. No results for input(s): LIPASE, AMYLASE in the last 168 hours. No results for input(s): AMMONIA in the last 168 hours. Coagulation Profile: No results  for input(s): INR, PROTIME in the last 168 hours. Cardiac Enzymes: No results for input(s): CKTOTAL, CKMB, CKMBINDEX, TROPONINI in the last 168 hours. BNP (last 3 results) No results for input(s): PROBNP in the last 8760 hours. HbA1C: No results for input(s): HGBA1C in the last 72 hours. CBG: No results for input(s): GLUCAP in the last 168 hours. Lipid Profile: No results for input(s): CHOL, HDL, LDLCALC, TRIG, CHOLHDL, LDLDIRECT in the last 72 hours. Thyroid Function Tests: No results for input(s): TSH, T4TOTAL, FREET4, T3FREE, THYROIDAB in the last 72 hours. Anemia Panel: No results for input(s): VITAMINB12, FOLATE, FERRITIN, TIBC, IRON, RETICCTPCT in the last 72 hours. Sepsis Labs: No results for input(s): PROCALCITON, LATICACIDVEN in the last 168 hours.  Recent Results (from the past 240 hour(s))    Surgical pcr screen     Status: Abnormal   Collection Time: 11/10/15 11:22 AM  Result Value Ref Range Status   MRSA, PCR NEGATIVE NEGATIVE Final   Staphylococcus aureus POSITIVE (A) NEGATIVE Final    Comment:        The Xpert SA Assay (FDA approved for NASAL specimens in patients over 12 years of age), is one component of a comprehensive surveillance program.  Test performance has been validated by [facility] for patients greater than or equal to 7 year old. It is not intended to diagnose infection nor to guide or monitor treatment.          Radiology Studies: Dg C-arm 1-60 Min  Result Date: 11/10/2015 CLINICAL DATA:  Left intratrochanteric hip fracture EXAM: DG C-ARM 61-120 MIN; OPERATIVE LEFT HIP WITH PELVIS COMPARISON:  11/09/2015 FLUOROSCOPY TIME:  Radiation Exposure Index (as provided by the fluoroscopic device): Not available If the device does not provide the exposure index: Fluoroscopy Time:  46.8 seconds Number of Acquired Images:  4 FINDINGS: Medullary rod is now noted in the proximal femur with fixation screw traversing the femoral neck. Fracture fragments are in near anatomic alignment. No soft tissue abnormality is noted. IMPRESSION: Status post ORIF of left femoral fracture. Electronically Signed   By: Alcide Clever M.D.   On: 11/10/2015 16:26   Dg Hip Operative Unilat W Or W/o Pelvis Left  Result Date: 11/10/2015 CLINICAL DATA:  Left intratrochanteric hip fracture EXAM: DG C-ARM 61-120 MIN; OPERATIVE LEFT HIP WITH PELVIS COMPARISON:  11/09/2015 FLUOROSCOPY TIME:  Radiation Exposure Index (as provided by the fluoroscopic device): Not available If the device does not provide the exposure index: Fluoroscopy Time:  46.8 seconds Number of Acquired Images:  4 FINDINGS: Medullary rod is now noted in the proximal femur with fixation screw traversing the femoral neck. Fracture fragments are in near anatomic alignment. No soft tissue abnormality is noted. IMPRESSION: Status post  ORIF of left femoral fracture. Electronically Signed   By: Alcide Clever M.D.   On: 11/10/2015 16:26   Dg Hip Unilat W Or Wo Pelvis 2-3 Views Left  Result Date: 11/09/2015 CLINICAL DATA:  20 year old male with history of trauma from a fall off of a 4 foot wall landing onto concrete. EXAM: DG HIP (WITH OR WITHOUT PELVIS) 2-3V LEFT COMPARISON:  Left femur radiograph 11/09/2015. FINDINGS: There is a very minimally displaced acute intertrochanteric left hip fracture. No significant angulation. Bony pelvis and right proximal femur as visualized appear intact. IMPRESSION: 1. Acute minimally displaced intertrochanteric fracture of the left hip. Electronically Signed   By: Trudie Reed M.D.   On: 11/09/2015 22:05   Dg Femur Min 2 Views Left  Result Date: 11/09/2015  CLINICAL DATA:  Pain, stepped off the wall yesterday evening. Unable to bear weight. Technologist notes state pain about the hip. EXAM: LEFT FEMUR 2 VIEWS COMPARISON:  None. FINDINGS: Mildly displaced fracture of the left proximal femur is likely intertrochanteric, however abuts the femoral neck. Distal femur is intact. Femoral head is seated in the acetabulum. IMPRESSION: Mildly displaced left proximal femur fracture, likely intertrochanteric. Recommend dedicated hip radiographs. These results will be called to the ordering clinician or representative by the Radiologist Assistant, and communication documented in the PACS or zVision Dashboard. Electronically Signed   By: Rubye Oaks M.D.   On: 11/09/2015 19:44        Scheduled Meds: . aspirin EC  325 mg Oral Q breakfast  . Chlorhexidine Gluconate Cloth  6 each Topical Daily  . docusate sodium  100 mg Oral BID  . mupirocin ointment  1 application Nasal BID   Continuous Infusions:     LOS: 2 days    Time spent: 30 minutes.     Kathlen Mody, MD [facility] Pager [phone] If 7PM-7AM, please contact night-coverage [email or web address] Password [facility] 11/11/2015, 5:31 PM

## 2015-11-11 NOTE — Progress Notes (Signed)
Nutrition Brief Note  RD consult to assess nutritional status/needs per hip fracture protocol.  Wt Readings from Last 15 Encounters:  11/11/15 132 lb (59.9 kg)  10/24/14 133 lb 12.8 oz (60.7 kg) (17 %, Z= -0.97)*  10/17/14 134 lb (60.8 kg) (17 %, Z= -0.96)*  05/03/12 120 lb (54.4 kg) (12 %, Z= -1.19)*  09/07/11 113 lb 12.8 oz (51.6 kg) (10 %, Z= -1.30)*  05/13/11 115 lb (52.2 kg) (14 %, Z= -1.06)*   * Growth percentiles are based on CDC 2-20 Years data.   Darren Horn is a 20 y.o. male with no significant past medical history who presents with hip pain after a fall.  S/p Procedure(s) 11/10/15: INTRAMEDULLARY (IM) NAIL INTERTROCHANTRIC, LEFT (Left)  Body mass index is 20.67 kg/m. Patient meets criteria for normal weight range based on current BMI.   Current diet order is soft, patient is consuming approximately 80% of meals at this time. Labs and medications reviewed.   No nutrition interventions warranted at this time. If nutrition issues arise, please consult RD.   Darren Horn A. Darren Horn, RD, LDN, CDE Pager: [phone] After hours Pager: [phone]

## 2015-11-11 NOTE — Op Note (Signed)
NAMFonda Kinder:  Darren Horn, Darren Horn              ACCOUNT NO.:  [id number]  MEDICAL RECORD NO.:  [id number]  LOCATION:  [id number]                        FACILITY:  [facility]  PHYSICIAN:  Vanessa Kampf C. Ophelia CharterYates, M.D.    DATE OF BIRTH:  09-Nov-1995  DATE OF PROCEDURE:  11/10/2015 DATE OF DISCHARGE:                              OPERATIVE REPORT   PREOPERATIVE DIAGNOSIS:  Left intertrochanteric fracture.  POSTOPERATIVE DIAGNOSIS:  Left intertrochanteric fracture.  PROCEDURE:  Left troch nail affixes short 9 x 100 mm lag screw.  SURGEON:  Bryan Goin C. Ophelia CharterYates, M.D.  ANESTHESIA:  General plus Marcaine local.  ESTIMATED BLOOD LOSS:  Less than 100 mL.  DESCRIPTION OF PROCEDURE:  After standard prepping and draping, with preoperative Ancef prophylaxis, on the fracture table.  Traction well leg holder.  Careful padding.  C-arm was brought in.  The hip was internally rotated.  Slight distraction and at this position, the fracture was barely able to be visualized along the intertrochanteric line.  After prepping, area was squared with towels, Betadine shower curtain.  Steri-Drape was applied.  Time-out procedure.  Incision was made proximal to trochanter.  Gluteus medius was split.  Tip of the trochanter was palpated.  Steinmann pin placed, reamed into the bone, over reamed, and then the 9 short nail was advanced without reaming the canal.  It was advanced with some difficulty having to rotated some as it went down the canal.  Once it was down in good position, the lag screw was placed after drilling the pin up below on the AP.  It noted the extreme hardness of the bone.  After drilling, it was tapped before the screw was placed, locked down securely, and then, a static lock was placed using the site guide.  When this was finished, guide was removed. Final x-rays were taken.  Irrigation with saline solution.  Closure with #1 Vicryl and the deep fascia 2-0 in the subcutaneous tissue and skin staple closure.  Postop dressing and  transferred to the recovery room.     Leah Skora C. Ophelia CharterYates, M.D.     MCY/MEDQ  D:  11/10/2015  T:  11/11/2015  Job:  [id number]

## 2015-11-11 NOTE — Care Management (Signed)
Called Dr Ophelia CharterYates office spoke to AlexandriaJean regarding order for home health PT. If ordered patient has [employer] insurance and CM will set up through [facility] 1 [phone]  Ronny FlurryHeather Tylar Amborn RN BSN [phone]

## 2015-11-12 LAB — BASIC METABOLIC PANEL
Anion gap: 8 (ref 5–15)
BUN: 12 mg/dL (ref 6–20)
CALCIUM: 8.8 mg/dL — AB (ref 8.9–10.3)
CO2: 28 mmol/L (ref 22–32)
CREATININE: 0.77 mg/dL (ref 0.61–1.24)
Chloride: 100 mmol/L — ABNORMAL LOW (ref 101–111)
GFR calc Af Amer: >60 mL/min (ref >60)
GLUCOSE: 102 mg/dL — AB (ref 65–99)
Potassium: 3.8 mmol/L (ref 3.5–5.1)
Sodium: 136 mmol/L (ref 135–145)

## 2015-11-12 LAB — CBC
HEMATOCRIT: 40.7 % (ref 39.0–52.0)
Hemoglobin: 13.7 g/dL (ref 13.0–17.0)
MCH: 28 pg (ref 26.0–34.0)
MCHC: 33.7 g/dL (ref 30.0–36.0)
MCV: 83.1 fL (ref 78.0–100.0)
PLATELETS: 139 10*3/uL — AB (ref 150–400)
RBC: 4.9 MIL/uL (ref 4.22–5.81)
RDW: 12.4 % (ref 11.5–15.5)
WBC: 8.3 10*3/uL (ref 4.0–10.5)

## 2015-11-12 MED ORDER — ASPIRIN 325 MG PO TBEC: 325.0000 mg | DELAYED_RELEASE_TABLET | Freq: Every day | ORAL | 0 refills | Status: AC

## 2015-11-12 MED ORDER — DEXTROSE 5 % IV SOLN
500.0000 mg | Freq: Once | INTRAVENOUS | Status: AC
  Administered 2015-11-12: 500 mg via INTRAVENOUS
  Filled 2015-11-12: qty 5

## 2015-11-12 MED ORDER — HYDROCODONE-ACETAMINOPHEN 5-325 MG PO TABS
1.0000 | ORAL_TABLET | ORAL | Status: DC | PRN
  Administered 2015-11-12 – 2015-11-13 (×4): 2 via ORAL
  Filled 2015-11-12 (×4): qty 2

## 2015-11-12 MED ORDER — OXYCODONE-ACETAMINOPHEN 5-325 MG PO TABS: 1.0000 | ORAL_TABLET | Freq: Four times a day (QID) | ORAL | 0 refills | Status: AC | PRN

## 2015-11-12 NOTE — Progress Notes (Signed)
Physical Therapy Treatment Patient Details Name: Darren AntuZachary T Heckel MRN: [id number] DOB: 1995/08/07 Today's Date: 11/12/2015    History of Present Illness 20 y.o. male [facility] s/p 3 ft fall onto concrete sustaining a L femur fx.  Pt s/p IM Nail on 11/10/15.  Pt with significant PMhx of pectus excavatum.      PT Comments    Pt continues to be very guarded and stiff today.  IV muscle relaxer did not seem to help any as far as his mobility and pain during mobility.  He is not only holding his left leg stiff, but his right leg as well.  I am afraid he will go home with no home follow up therapy and that he will have a poor outcome for his hip because he will either fall or continue to be so stiff that he will develop a contracture.  PT continues to strongly recommend HHPT and I do not feel he is mobile enough yet to d/c home.  He has 4 STE his home and at least 20' to walk to get to the nearest chair and based on how slowly he moved today that may take him 30 minutes to accomplish.    Follow Up Recommendations  Home health PT;Supervision for mobility/OOB     Equipment Recommendations  Rolling walker with 5" wheels;3in1 (PT);Wheelchair (measurements PT);Wheelchair cushion (measurements PT) (with elevating leg rests)    Recommendations for Other Services   NA     Precautions / Restrictions Precautions Precautions: Fall Precaution Comments: due to pain and stiffness Restrictions LLE Weight Bearing: Weight bearing as tolerated    Mobility  Bed Mobility               General bed mobility comments: Pt OOB in the chair  Transfers Overall transfer level: Needs assistance Equipment used: Rolling walker (2 wheeled) Transfers: Sit to/from Stand Sit to Stand: Min assist         General transfer comment: Min assist to support truk as pt walks his hands out to edge of recliner chiar.  He is having difficulty bending at the knee and between the trunk and his upper thigh  despite preforming LE exercises before mobilizing he is extremely stiff and guarded.  Pt needs near constant cues to breathe, remember to relax his muslces and that he is allowed to bend his knee.  Significant extra time needed for transitions to stand and to sit.    Ambulation/Gait Ambulation/Gait assistance: +2 safety/equipment;Min assist Ambulation Distance (Feet): 20 Feet Assistive device: Rolling walker (2 wheeled) Gait Pattern/deviations: Step-to pattern;Narrow base of support (vaulting) Gait velocity: decreased Gait velocity interpretation: <1.8 ft/sec, indicative of risk for recurrent falls General Gait Details: Pt is using bil upper extremities to essentially lift himself up with both hands and move forward. He is standing on, essentially bil toes.  Verbal cues to try to get left foot flat and quite a few steps needed before he could progress left leg forward better.  It took over 10 minutes to walk 20 feet with chair to follow for safety and to try to encourage increased gait distance.            Balance Overall balance assessment: Needs assistance Sitting-balance support: Feet supported;Bilateral upper extremity supported Sitting balance-Leahy Scale: Poor Sitting balance - Comments: Pt sits with bil upper extremities engaged to avoid forward flexion over leg.    Standing balance support: Bilateral upper extremity supported Standing balance-Leahy Scale: Poor Standing balance comment: Holding  his enitre lower half up on his arms with the use of the RW.                      Cognition Arousal/Alertness: Awake/alert Behavior During Therapy: WFL for tasks assessed/performed Overall Cognitive Status: Within Functional Limits for tasks assessed                      Exercises Total Joint Exercises Ankle Circles/Pumps: AROM;Both;20 reps Quad Sets: AROM;Both;10 reps Heel Slides: AAROM;Left;10 reps Hip ABduction/ADduction: AAROM;Left;10 reps    General Comments  General comments (skin integrity, edema, etc.): Pt is stiff and guarded in his right leg to, unable to move it as well because he is so tense.        Pertinent Vitals/Pain Pain Assessment: 0-10 Pain Score: 8  Pain Location: left hip Pain Descriptors / Indicators: Aching;Burning;Grimacing;Guarding;Spasm Pain Intervention(s): Limited activity within patient's tolerance;Monitored during session;Repositioned;Ice applied    PT Goals (current goals can now be found in the care plan section) Acute Rehab PT Goals Patient Stated Goal: to be able to walk again Progress towards PT goals: Progressing toward goals    Frequency  Min 5X/week    PT Plan Current plan remains appropriate       End of Session Equipment Utilized During Treatment: Gait belt Activity Tolerance: Patient limited by pain Patient left: in chair;with call bell/phone within reach;with family/visitor present     Time: 1914-7829 PT Time Calculation (min) (ACUTE ONLY): 30 min  Charges:  $Gait Training: 8-22 mins $Therapeutic Exercise: 8-22 mins                      Sigourney Portillo B. Jennea Rager, PT, DPT [phone]   11/12/2015, 4:20 PM

## 2015-11-12 NOTE — Progress Notes (Signed)
Subjective: 2 Days Post-Op Procedure(s) (LRB): INTRAMEDULLARY (IM) NAIL INTERTROCHANTRIC, LEFT (Left) Patient reports pain as mild.    Objective: Vital signs in last 24 hours: Temp:  [98.2 F (36.8 C)-99.4 F (37.4 C)] 98.2 F (36.8 C) (08/08 0635) Pulse Rate:  [76-81] 81 (08/08 0635) Resp:  [16-17] 16 (08/08 0635) BP: (120-141)/(70-83) 141/71 (08/08 0635) SpO2:  [97 %] 97 % (08/08 0635) Weight:  [59.9 kg (132 lb)] 59.9 kg (132 lb) (08/07 1518)  Intake/Output from previous day: 08/07 0701 - 08/08 0700 In: 540 [P.O.:540] Out: 3125 [Urine:3125] Intake/Output this shift: No intake/output data recorded.   Recent Labs  11/09/15 2100 11/10/15 0438 11/11/15 0335 11/12/15 0449  HGB 15.3 14.2 13.8 13.7    Recent Labs  11/11/15 0335 11/12/15 0449  WBC 8.0 8.3  RBC 4.75 4.90  HCT 39.4 40.7  PLT 131* 139*    Recent Labs  11/11/15 0335 11/12/15 0449  NA 134* 136  K 4.2 3.8  CL 98* 100*  CO2 28 28  BUN 7 12  CREATININE 0.84 0.77  GLUCOSE 149* 102*  CALCIUM 9.0 8.8*   No results for input(s): LABPT, INR in the last 72 hours.  Compartment soft  Assessment/Plan: 2 Days Post-Op Procedure(s) (LRB): INTRAMEDULLARY (IM) NAIL INTERTROCHANTRIC, LEFT (Left) Up with therapy , discharge home. ASA times 4 wks for DVT prophy.  Office 2 wks.  Rx pain on chart.   Ezio Wieck C 11/12/2015, 7:49 AM

## 2015-11-12 NOTE — Care Management Note (Addendum)
Case Management Note  Patient Details  Name: Darren Horn MRN: [id number] Date of Birth: November 05, 1995  Subjective/Objective:                    Action/Plan:   Expected Discharge Date:                  Expected Discharge Plan:  Home/Self Care  In-House Referral:     Discharge planning Services  CM Consult  Post Acute Care Choice:  Durable Medical Equipment Choice offered to:  Patient, Parent  DME Arranged:  3-N-1, Walker rolling, Tub bench DME Agency:  [facility]  [facility]     Status of Service:  Completed, signed off  If discussed at Long Length of Stay Meetings, dates discussed:   Spoke with patient and his mother at bedside . Both aware no home health ordered. [facility] . Additional Comments:  Kingsley PlanWile, Marjarie Irion Marie, RN 11/12/2015, 1:54 PM

## 2015-11-12 NOTE — Progress Notes (Signed)
PROGRESS NOTE    Lenell AntuZachary T Aguino  JXB:[id number] DOB: 1995/05/11 DOA: 11/09/2015 PCP: No PCP Per Patient    Brief Narrative:  Lenell AntuZachary T Deam is a 20 y.o. male with no significant past medical history who presents with hip pain after a fall   Assessment & Plan:   Principal Problem:   Closed intertrochanteric fracture of left hip (HCC)   Left intertrochanteric fracture of the left hip: S/p IM nail placement and repair by Dr Ophelia CharterYates on 8/6.  Pain control and muscle relaxants as needed.  Pain still not well controlled. Added norco.  Extra dose of robaxin given today.   Mild thrombocytopenia: Stable and improving.    Hypokalemia: repleted as needed.   DVT prophylaxis: (Lovenox/) Code Status: (Full) Family Communication: none at bedside.  Disposition Plan: pending PT eval.    Consultants:   Orthopedics.    Procedures: s/p intramedullary nail placement.    Antimicrobials: none    Subjective: Pain not well controlled, added norco.  Objective: Vitals:   11/11/15 1518 11/11/15 2136 11/12/15 0635 11/12/15 1416  BP:  120/70 (!) 141/71 125/76  Pulse:  78 81 86  Resp:  17 16 16   Temp:  99.4 F (37.4 C) 98.2 F (36.8 C) 98.6 F (37 C)  TempSrc:  Oral Oral Oral  SpO2:  97% 97% 99%  Weight: 59.9 kg (132 lb)     Height: 5\' 7"  (1.702 m)       Intake/Output Summary (Last 24 hours) at 11/12/15 1559 Last data filed at 11/12/15 1416  Gross per 24 hour  Intake              540 ml  Output             1550 ml  Net            -1010 ml   Filed Weights   11/10/15 0112 11/11/15 1518  Weight: 60.3 kg (132 lb 15 oz) 59.9 kg (132 lb)    Examination:  General exam: in mild distress from pain.  Respiratory system: Clear to auscultation. Respiratory effort normal. No wheezing or rhonchi.  Cardiovascular system: S1 & S2 heard, RRR. No JVD, murmurs, rubs, gallops or clicks. No pedal edema. Gastrointestinal system: Abdomen is nondistended, soft and nontender. No organomegaly  or masses felt. Normal bowel sounds heard. Central nervous system: Alert and oriented. No focal neurological deficits. Extremities:left lower extremity painful rom. Mild tenderness on the left hip.  Psychiatry: Judgement and insight appear normal. Mood & affect appropriate.     Data Reviewed: I have personally reviewed following labs and imaging studies  CBC:  Recent Labs Lab 11/09/15 2100 11/10/15 0438 11/11/15 0335 11/12/15 0449  WBC 8.4 7.2 8.0 8.3  NEUTROABS 5.5  --   --   --   HGB 15.3 14.2 13.8 13.7  HCT 44.1 42.1 39.4 40.7  MCV 82.3 83.9 82.9 83.1  PLT 120* 123* 131* 139*   Basic Metabolic Panel:  Recent Labs Lab 11/09/15 2100 11/10/15 0438 11/11/15 0335 11/12/15 0449  NA 136 137 134* 136  K 3.4* 3.9 4.2 3.8  CL 103 103 98* 100*  CO2 26 28 28 28   GLUCOSE 98 128* 149* 102*  BUN 12 12 7 12   CREATININE 0.77 0.89 0.84 0.77  CALCIUM 9.1 8.9 9.0 8.8*   GFR: Estimated Creatinine Clearance: 124.8 mL/min (by C-G formula based on SCr of 0.8 mg/dL). Liver Function Tests: No results for input(s): AST, ALT, ALKPHOS, BILITOT, PROT, ALBUMIN in  the last 168 hours. No results for input(s): LIPASE, AMYLASE in the last 168 hours. No results for input(s): AMMONIA in the last 168 hours. Coagulation Profile: No results for input(s): INR, PROTIME in the last 168 hours. Cardiac Enzymes: No results for input(s): CKTOTAL, CKMB, CKMBINDEX, TROPONINI in the last 168 hours. BNP (last 3 results) No results for input(s): PROBNP in the last 8760 hours. HbA1C: No results for input(s): HGBA1C in the last 72 hours. CBG: No results for input(s): GLUCAP in the last 168 hours. Lipid Profile: No results for input(s): CHOL, HDL, LDLCALC, TRIG, CHOLHDL, LDLDIRECT in the last 72 hours. Thyroid Function Tests: No results for input(s): TSH, T4TOTAL, FREET4, T3FREE, THYROIDAB in the last 72 hours. Anemia Panel: No results for input(s): VITAMINB12, FOLATE, FERRITIN, TIBC, IRON, RETICCTPCT in  the last 72 hours. Sepsis Labs: No results for input(s): PROCALCITON, LATICACIDVEN in the last 168 hours.  Recent Results (from the past 240 hour(s))  Surgical pcr screen     Status: Abnormal   Collection Time: 11/10/15 11:22 AM  Result Value Ref Range Status   MRSA, PCR NEGATIVE NEGATIVE Final   Staphylococcus aureus POSITIVE (A) NEGATIVE Final    Comment:        The Xpert SA Assay (FDA approved for NASAL specimens in patients over 71 years of age), is one component of a comprehensive surveillance program.  Test performance has been validated by [facility] for patients greater than or equal to 67 year old. It is not intended to diagnose infection nor to guide or monitor treatment.          Radiology Studies: Dg C-arm 1-60 Min  Result Date: 11/10/2015 CLINICAL DATA:  Left intratrochanteric hip fracture EXAM: DG C-ARM 61-120 MIN; OPERATIVE LEFT HIP WITH PELVIS COMPARISON:  11/09/2015 FLUOROSCOPY TIME:  Radiation Exposure Index (as provided by the fluoroscopic device): Not available If the device does not provide the exposure index: Fluoroscopy Time:  46.8 seconds Number of Acquired Images:  4 FINDINGS: Medullary rod is now noted in the proximal femur with fixation screw traversing the femoral neck. Fracture fragments are in near anatomic alignment. No soft tissue abnormality is noted. IMPRESSION: Status post ORIF of left femoral fracture. Electronically Signed   By: Alcide Clever M.D.   On: 11/10/2015 16:26   Dg Hip Operative Unilat W Or W/o Pelvis Left  Result Date: 11/10/2015 CLINICAL DATA:  Left intratrochanteric hip fracture EXAM: DG C-ARM 61-120 MIN; OPERATIVE LEFT HIP WITH PELVIS COMPARISON:  11/09/2015 FLUOROSCOPY TIME:  Radiation Exposure Index (as provided by the fluoroscopic device): Not available If the device does not provide the exposure index: Fluoroscopy Time:  46.8 seconds Number of Acquired Images:  4 FINDINGS: Medullary rod is now noted in the proximal femur with  fixation screw traversing the femoral neck. Fracture fragments are in near anatomic alignment. No soft tissue abnormality is noted. IMPRESSION: Status post ORIF of left femoral fracture. Electronically Signed   By: Alcide Clever M.D.   On: 11/10/2015 16:26        Scheduled Meds: . aspirin EC  325 mg Oral Q breakfast  . Chlorhexidine Gluconate Cloth  6 each Topical Daily  . docusate sodium  100 mg Oral BID  . mupirocin ointment  1 application Nasal BID   Continuous Infusions:     LOS: 3 days    Time spent: 30 minutes.     Kathlen Mody, MD [facility] Pager [phone] If 7PM-7AM, please contact night-coverage [email or web address] Password [email or web address] 11/12/2015, 3:59 PM

## 2015-11-12 NOTE — Progress Notes (Signed)
Occupational Therapy Treatment Patient Details Name: Darren Horn Sheard MRN: [id number] DOB: [birth year] Today's Date: 11/12/2015    History of present illness 20 [facility].o. [facility] s/p 3 ft fall onto concrete sustaining a L femur fx.  Pt s/p IM Nail on 11/10/15.  Pt with significant PMhx of pectus excavatum.     OT comments  This 20 yo male admitted and underwent above is making small gains today with mobility and basic ADL education. He is not ready from an OT standpoint today to go home--needs a lot of A from a basic mobility standpoint that affect his ability to perform basic ADLs. He will benefit from continue acute OT with follow up HHOT.  Follow Up Recommendations  Home health OT    Equipment Recommendations  3 in 1 bedside comode;Tub/shower bench;Other (comment) (dad aware tub bench is an out of pocket expense)       Precautions / Restrictions Precautions Precautions: Fall Precaution Comments: due to pain and stiffness Restrictions Weight Bearing Restrictions: Yes LLE Weight Bearing: Weight bearing as tolerated       Mobility Bed Mobility   Bed Mobility: Supine to Sit     Supine to sit: Mod assist (increased time and use of pad)     General bed mobility comments: Will be sleeping in a recliner initially  Transfers Overall transfer level: Needs assistance Equipment used: Rolling walker (2 wheeled) Transfers: Sit to/from Stand Sit to Stand: Min assist;From elevated surface         General transfer comment: Pt ambulated 8 feet with mod A (including A to advance his LLE) and Mod cues for sequencing     Balance Overall balance assessment: Needs assistance Sitting-balance support: Feet supported;Single extremity supported (but this is difficult for him and takes increased time for him to achieve) Sitting balance-Leahy Scale: Poor Sitting balance - Comments: reliant on UE A   Standing balance support: Bilateral upper extremity supported;During  functional activity Standing balance-Leahy Scale: Poor Standing balance comment: reliant on RW                   ADL Overall ADL's : Needs assistance/impaired                     Lower Body Dressing: Maximal assistance;With adaptive equipment Lower Body Dressing Details (indicate cue type and reason): min A sit<>stand from raised bed with increased time and only able to do RLE at this time due to increased spams and decreased movement of LLE Toilet Transfer: Minimal assistance Toilet Transfer Details (indicate cue type and reason): sit to stand from a higher surface (sit>stand from bed raised, stand to sit recliner) Toileting- Clothing Manipulation and Hygiene: Total assistance (min A standing)         General ADL Comments: It took pt an extraordinary amount of time and effort to do what was asked of him today (due to his spasms greatly interferring with his capabilities.                Cognition   Behavior During Therapy: WFL for tasks assessed/performed Overall Cognitive Status: Within Functional Limits for tasks assessed                                           Frequency Min 3X/week     Progress Toward Goals  OT Goals(current goals  can now be found in the care plan section)  Progress towards OT goals: Progressing toward goals (very slowly)     Plan Discharge plan remains appropriate       End of Session Equipment Utilized During Treatment: Gait belt;Rolling walker   Activity Tolerance Patient limited by pain (and spasms)   Patient Left in chair;with call bell/phone within reach;with family/visitor present   Nurse Communication  (when could pt get IV Robaxin)        Time: 0940-1030 OT Time Calculation (min): 50 min  Charges: OT Evaluation $OT Eval Moderate Complexity: 1 Procedure OT Treatments $Self Care/Home Management : 38-52 mins  Evette Georges [phone] 11/12/2015, 11:23 AM

## 2015-11-12 NOTE — Care Management (Addendum)
Called Dr Ophelia CharterYates office spoke with Claris CheMargaret regarding home health PT. Awaiting call back / orders.   Ronny FlurryHeather Ilija Maxim RN BSN [phone]    0941 Called Dr Ophelia CharterYates office again. Ronny FlurryHeather Luva Metzger RN BSN [phone]  0944 DR Ophelia CharterYates called back no need for HHPT. Ronny FlurryHeather Jasminemarie Sherrard RN BSN [phone]

## 2015-11-13 LAB — CBC
HEMATOCRIT: 42.5 % (ref 39.0–52.0)
HEMOGLOBIN: 14.5 g/dL (ref 13.0–17.0)
MCH: 28.5 pg (ref 26.0–34.0)
MCHC: 34.1 g/dL (ref 30.0–36.0)
MCV: 83.5 fL (ref 78.0–100.0)
Platelets: 153 10*3/uL (ref 150–400)
RBC: 5.09 MIL/uL (ref 4.22–5.81)
RDW: 12.3 % (ref 11.5–15.5)
WBC: 7.8 10*3/uL (ref 4.0–10.5)

## 2015-11-13 MED ORDER — METHOCARBAMOL 500 MG PO TABS: 500.0000 mg | ORAL_TABLET | Freq: Three times a day (TID) | ORAL | 0 refills | Status: AC | PRN

## 2015-11-13 MED ORDER — BISACODYL 10 MG RE SUPP: 10.0000 mg | Freq: Every day | RECTAL | 0 refills | Status: AC | PRN

## 2015-11-13 MED ORDER — DOCUSATE SODIUM 100 MG PO CAPS: 100.0000 mg | ORAL_CAPSULE | Freq: Two times a day (BID) | ORAL | 0 refills | Status: AC

## 2015-11-13 MED ORDER — POLYETHYLENE GLYCOL 3350 17 G PO PACK: 17.0000 g | PACK | Freq: Every day | ORAL | 0 refills | Status: AC | PRN

## 2015-11-13 NOTE — Progress Notes (Signed)
Physical Therapy Treatment Patient Details Name: Darren Horn MRN: [id number] DOB: [birth year] Today's Date: 11/13/2015    History of Present Illness 20 [facility].o. [facility] s/p 3 ft fall onto concrete sustaining a L femur fx.  Pt s/p IM Nail on 11/10/15.  Pt with significant PMhx of pectus excavatum.      PT Comments    Pt observed in bed attempting lower body dressing.  PTA educated pt to dress surgical limb first to improve ease.  Pt reports no further questions in regards to mobility.  PTA performed family training with parents to ensure safe entry into home.  Pt to d/c soon.    Follow Up Recommendations  Home health PT;Supervision for mobility/OOB     Equipment Recommendations  Rolling walker with 5" wheels;3in1 (PT);Wheelchair (measurements PT);Wheelchair cushion (measurements PT)    Recommendations for Other Services       Precautions / Restrictions Precautions Precautions: Fall Restrictions Weight Bearing Restrictions: Yes LLE Weight Bearing: Weight bearing as tolerated    Mobility  Bed Mobility                  Transfers                    Ambulation/Gait                 Stairs Stairs: Yes       General stair comments: PTA reviewed WC technique with father and mother.  PTA instructed father on how to move anti tippers for "Bump up technique in WC".  PTA educated on hand placement on WC frame and to avoid moving parts.  PTA [facility] as [facility] is the [facility]est method.  Father and mother able to verbalize understanding and teach back method to PTA.    Wheelchair Mobility    Modified Rankin (Stroke Patients Only)       Balance                                    Cognition Arousal/Alertness: Awake/alert Behavior During Therapy: WFL for tasks assessed/performed Overall Cognitive Status: Within Functional Limits for tasks assessed                      Exercises       General Comments        Pertinent Vitals/Pain Pain Assessment: Faces Faces Pain Scale: Hurts a little bit Pain Intervention(s): Limited activity within patient's tolerance    Home Living                      Prior Function            PT Goals (current goals can now be found in the care plan section) Acute Rehab PT Goals Patient Stated Goal: to be able to walk again Potential to Achieve Goals: Good Progress towards PT goals: Progressing toward goals    Frequency  Min 5X/week    PT Plan Current plan remains appropriate    Co-evaluation             End of Session     Patient left: in bed;with call bell/phone within reach     Time: 1522-1531 PT Time Calculation (min) (ACUTE ONLY): 9 min  Charges:  $Therapeutic Activity: 8-22 mins  G Codes:      Florestine Avers 11/13/2015, 3:36 PM  Joycelyn Rua, PTA pager [phone]

## 2015-11-13 NOTE — Discharge Summary (Signed)
Physician Discharge Summary  Darren Horn ZOX:[id number] DOB: 04/18/1995 DOA: 11/09/2015  PCP: No PCP Per Patient  Admit date: 11/09/2015 Discharge date: 11/13/2015  Admitted From: Home Disposition:  Home  Recommendations for Outpatient Follow-up:  1. Follow up with new PCP in 1-2 weeks 2. Follow up with Orthopaedics in 2 weeks 3. Take new medications as prescribed  Home Health:No Equipment/Devices:N/A  Discharge Condition: Stable CODE STATUS: Full Diet recommendation: Regular  Brief/Interim Summary: Darren Horn is a 20 y.o male with no significant PMH admitted on 11/09/15 with hip pain after a fall.  He sustained a Mildly displaced left proximal femur fracture, likely intertrochanteric.  He is s/p IM nail placement and repair by Dr. Ophelia Charter on 11/10/15.  Pain is well controlled and muscle contractures improved with treatment with Robaxin.  He voices that he is ready to go home today. Last bowel movement was morning of discharge.  Urinating and eating well.  Discharge Diagnoses:  Principal Problem:   Closed intertrochanteric fracture of left hip [facility])    Discharge Instructions  Discharge Instructions    Diet - low sodium heart healthy    Complete by:  As directed   Increase activity slowly    Complete by:  As directed   Weight bearing as tolerated    Complete by:  As directed       Medication List    TAKE these medications   aspirin 325 MG EC tablet Take 1 tablet (325 mg total) by mouth daily with breakfast.   bisacodyl 10 MG suppository Commonly known as:  DULCOLAX Place 1 suppository (10 mg total) rectally daily as needed for moderate constipation.   docusate sodium 100 MG capsule Commonly known as:  COLACE Take 1 capsule (100 mg total) by mouth 2 (two) times daily.   methocarbamol 500 MG tablet Commonly known as:  ROBAXIN Take 1 tablet (500 mg total) by mouth every 8 (eight) hours as needed for muscle spasms.   oxyCODONE-acetaminophen 5-325 MG  tablet Commonly known as:  ROXICET Take 1-2 tablets by mouth every 6 (six) hours as needed for severe pain.   polyethylene glycol packet Commonly known as:  MIRALAX / GLYCOLAX Take 17 g by mouth daily as needed for mild constipation.      Follow-up Information    YATES,MARK C, MD Follow up in 2 week(s).   Specialty:  Orthopedic Surgery Contact information: [address] [place] [address] [phone]        KNAPP,EVE A, MD. Schedule an appointment as soon as possible for a visit in 2 week(s).   Specialty:  Family Medicine Why:  Please call and set up appointment to establish care Contact information: [address] [place] [address] [phone]          No Known Allergies  Consultations:  Orthopaedic Surgery   Procedures/Studies: Dg C-arm 1-60 Min  Result Date: 11/10/2015 CLINICAL DATA:  Left intratrochanteric hip fracture EXAM: DG C-ARM 61-120 MIN; OPERATIVE LEFT HIP WITH PELVIS COMPARISON:  11/09/2015 FLUOROSCOPY TIME:  Radiation Exposure Index (as provided by the fluoroscopic device): Not available If the device does not provide the exposure index: Fluoroscopy Time:  46.8 seconds Number of Acquired Images:  4 FINDINGS: Medullary rod is now noted in the proximal femur with fixation screw traversing the femoral neck. Fracture fragments are in near anatomic alignment. No soft tissue abnormality is noted. IMPRESSION: Status post ORIF of left femoral fracture. Electronically Signed   By: Alcide Clever M.D.   On:  11/10/2015 16:26   Dg Hip Operative Unilat W Or W/o Pelvis Left  Result Date: 11/10/2015 CLINICAL DATA:  Left intratrochanteric hip fracture EXAM: DG C-ARM 61-120 MIN; OPERATIVE LEFT HIP WITH PELVIS COMPARISON:  11/09/2015 FLUOROSCOPY TIME:  Radiation Exposure Index (as provided by the fluoroscopic device): Not available If the device does not provide the exposure index: Fluoroscopy Time:  46.8 seconds Number of Acquired Images:  4 FINDINGS:  Medullary rod is now noted in the proximal femur with fixation screw traversing the femoral neck. Fracture fragments are in near anatomic alignment. No soft tissue abnormality is noted. IMPRESSION: Status post ORIF of left femoral fracture. Electronically Signed   By: Alcide Clever M.D.   On: 11/10/2015 16:26   Dg Hip Unilat W Or Wo Pelvis 2-3 Views Left  Result Date: 11/09/2015 CLINICAL DATA:  20 year old male with history of trauma from a fall off of a 4 foot wall landing onto concrete. EXAM: DG HIP (WITH OR WITHOUT PELVIS) 2-3V LEFT COMPARISON:  Left femur radiograph 11/09/2015. FINDINGS: There is a very minimally displaced acute intertrochanteric left hip fracture. No significant angulation. Bony pelvis and right proximal femur as visualized appear intact. IMPRESSION: 1. Acute minimally displaced intertrochanteric fracture of the left hip. Electronically Signed   By: Trudie Reed M.D.   On: 11/09/2015 22:05   Dg Femur Min 2 Views Left  Result Date: 11/09/2015 CLINICAL DATA:  Pain, stepped off the wall yesterday evening. Unable to bear weight. Technologist notes state pain about the hip. EXAM: LEFT FEMUR 2 VIEWS COMPARISON:  None. FINDINGS: Mildly displaced fracture of the left proximal femur is likely intertrochanteric, however abuts the femoral neck. Distal femur is intact. Femoral head is seated in the acetabulum. IMPRESSION: Mildly displaced left proximal femur fracture, likely intertrochanteric. Recommend dedicated hip radiographs. These results will be called to the ordering clinician or representative by the Radiologist Assistant, and communication documented in the PACS or zVision Dashboard. Electronically Signed   By: Rubye Oaks M.D.   On: 11/09/2015 19:44     Subjective: Patient in good spirits this morning.  He is ready to go home and family voices they are hopeful for discharge today.  No concerns today about pain or muscle contractions- family voices that pain and muscle spasms  have been well controlled with pain medication and muscle relaxants.   Discharge Exam: Vitals:   11/12/15 2121 11/13/15 0535  BP: (!) 119/97 127/69  Pulse: 74 60  Resp: 15 16  Temp: 97.8 F (36.6 C) 97.7 F (36.5 C)   Vitals:   11/12/15 0635 11/12/15 1416 11/12/15 2121 11/13/15 0535  BP: (!) 141/71 125/76 (!) 119/97 127/69  Pulse: 81 86 74 60  Resp: 16 16 15 16   Temp: 98.2 F (36.8 C) 98.6 F (37 C) 97.8 F (36.6 C) 97.7 F (36.5 C)  TempSrc: Oral Oral Oral Oral  SpO2: 97% 99% 100% 100%  Weight:      Height:        General: Pt is alert, awake, not in acute distress Cardiovascular: RRR, S1/S2 +, no rubs, no gallops Respiratory: CTA bilaterally, no wheezing, no rhonchi Abdominal: Soft, NT, ND, bowel sounds + Extremities: no edema, no cyanosis, able to plantar and dorsi flex at ankle bilaterally, strength 5/5 of upper extremities bilaterally.    The results of significant diagnostics from this hospitalization (including imaging, microbiology, ancillary and laboratory) are listed below for reference.     Microbiology: Recent Results (from the past 240 hour(s))  Surgical pcr  screen     Status: Abnormal   Collection Time: 11/10/15 11:22 AM  Result Value Ref Range Status   MRSA, PCR NEGATIVE NEGATIVE Final   Staphylococcus aureus POSITIVE (A) NEGATIVE Final    Comment:        The Xpert SA Assay (FDA approved for NASAL specimens in patients over 71 years of age), is one component of a comprehensive surveillance program.  Test performance has been validated by [facility] for patients greater than or equal to 59 year old. It is not intended to diagnose infection nor to guide or monitor treatment.      Labs: BNP (last 3 results) No results for input(s): BNP in the last 8760 hours. Basic Metabolic Panel:  Recent Labs Lab 11/09/15 2100 11/10/15 0438 11/11/15 0335 11/12/15 0449  NA 136 137 134* 136  K 3.4* 3.9 4.2 3.8  CL 103 103 98* 100*  CO2 26 28 28  28   GLUCOSE 98 128* 149* 102*  BUN 12 12 7 12   CREATININE 0.77 0.89 0.84 0.77  CALCIUM 9.1 8.9 9.0 8.8*   Liver Function Tests: No results for input(s): AST, ALT, ALKPHOS, BILITOT, PROT, ALBUMIN in the last 168 hours. No results for input(s): LIPASE, AMYLASE in the last 168 hours. No results for input(s): AMMONIA in the last 168 hours. CBC:  Recent Labs Lab 11/09/15 2100 11/10/15 0438 11/11/15 0335 11/12/15 0449 11/13/15 0543  WBC 8.4 7.2 8.0 8.3 7.8  NEUTROABS 5.5  --   --   --   --   HGB 15.3 14.2 13.8 13.7 14.5  HCT 44.1 42.1 39.4 40.7 42.5  MCV 82.3 83.9 82.9 83.1 83.5  PLT 120* 123* 131* 139* 153   Cardiac Enzymes: No results for input(s): CKTOTAL, CKMB, CKMBINDEX, TROPONINI in the last 168 hours. BNP: Invalid input(s): POCBNP CBG: No results for input(s): GLUCAP in the last 168 hours. D-Dimer No results for input(s): DDIMER in the last 72 hours. Hgb A1c No results for input(s): HGBA1C in the last 72 hours. Lipid Profile No results for input(s): CHOL, HDL, LDLCALC, TRIG, CHOLHDL, LDLDIRECT in the last 72 hours. Thyroid function studies No results for input(s): TSH, T4TOTAL, T3FREE, THYROIDAB in the last 72 hours.  Invalid input(s): FREET3 Anemia work up No results for input(s): VITAMINB12, FOLATE, FERRITIN, TIBC, IRON, RETICCTPCT in the last 72 hours. Urinalysis    Component Value Date/Time   COLORURINE AMBER (A) 11/09/2015 2220   APPEARANCEUR CLEAR 11/09/2015 2220   LABSPEC 1.037 (H) 11/09/2015 2220   PHURINE 6.5 11/09/2015 2220   GLUCOSEU 100 (A) 11/09/2015 2220   HGBUR NEGATIVE 11/09/2015 2220   BILIRUBINUR SMALL (A) 11/09/2015 2220   KETONESUR 15 (A) 11/09/2015 2220   PROTEINUR 30 (A) 11/09/2015 2220   NITRITE NEGATIVE 11/09/2015 2220   LEUKOCYTESUR NEGATIVE 11/09/2015 2220   Sepsis Labs Invalid input(s): PROCALCITONIN,  WBC,  LACTICIDVEN Microbiology Recent Results (from the past 240 hour(s))  Surgical pcr screen     Status: Abnormal    Collection Time: 11/10/15 11:22 AM  Result Value Ref Range Status   MRSA, PCR NEGATIVE NEGATIVE Final   Staphylococcus aureus POSITIVE (A) NEGATIVE Final    Comment:        The Xpert SA Assay (FDA approved for NASAL specimens in patients over 31 years of age), is one component of a comprehensive surveillance program.  Test performance has been validated by [facility] for patients greater than or equal to 65 year old. It is not intended to diagnose infection  nor to guide or monitor treatment.      Time coordinating discharge: Over 30 minutes  SIGNED:   Bennett ScrapeAlex Ukleja, MD  [facility] 11/13/2015, 12:18 PM Pager [phone]  If 7PM-7AM, please contact night-coverage [email or web address] Password [email or web address]

## 2015-11-13 NOTE — Progress Notes (Signed)
D/C papers gone over with pt. And his parents. Prescriptions given to pt.'s mother. Home equipment delivered to pt.'s room. IV taken out. No questions/complaints. Pt. D/c'd successfully via w/c.

## 2015-11-13 NOTE — Discharge Instructions (Signed)
Hip Fracture A hip fracture is a fracture of the upper part of your thigh bone (femur).  CAUSES A hip fracture is caused by a direct blow to the side of your hip. This is usually the result of a fall but can occur in other circumstances, such as an automobile accident. RISK FACTORS There is an increased risk of hip fractures in people with:  An unsteady walking pattern (gait) and those with conditions that contribute to poor balance, such as Parkinson's disease or dementia.  Osteopenia and osteoporosis.  Cancer that spreads to the leg bones.  Certain metabolic diseases. SYMPTOMS  Symptoms of hip fracture include:  Pain over the injured hip.  Inability to put weight on the leg in which the fracture occurred (although, some patients are able to walk after a hip fracture).  Toes and foot of the affected leg point outward when you lie down. DIAGNOSIS A physical exam can determine if a hip fracture is likely to have occurred. X-ray exams are needed to confirm the fracture and to look for other injuries. The X-ray exam can help to determine the type of hip fracture. Rarely, the fracture is not visible on an X-ray image and a CT scan or MRI will have to be done. TREATMENT  The treatment for a fracture is usually surgery. This means using a screw, nail, or rod to hold the bones in place.  HOME CARE INSTRUCTIONS Take all medicines as directed by your health care provider. SEEK MEDICAL CARE IF: Pain continues, even after taking pain medicine. MAKE SURE YOU:  Understand these instructions.   Will watch your condition.  Will get help right away if you are not doing well or get worse.   This information is not intended to replace advice given to you by your health care provider. Make sure you discuss any questions you have with your health care provider.   Document Released: 03/23/2005 Document Revised: 03/28/2013 Document Reviewed: 11/02/2012 Elsevier Interactive Patient Education 2016  [employer]  

## 2016-01-07 ENCOUNTER — Ambulatory Visit (INDEPENDENT_AMBULATORY_CARE_PROVIDER_SITE_OTHER): Payer: Managed Care, Other (non HMO) | Admitting: Orthopaedic Surgery

## 2016-01-07 DIAGNOSIS — S72142D Displaced intertrochanteric fracture of left femur, subsequent encounter for closed fracture with routine healing: Secondary | ICD-10-CM | POA: Diagnosis not present

## 2017-05-29 IMAGING — CR DG HIP (WITH OR WITHOUT PELVIS) 2-3V*L*
3 series · 3 of 3 positions shown · non-contrast
Comparison: Left femur radiograph 11/09/2015.

CLINICAL DATA: 19-year-old male with history of trauma from a fall
off of a 4 foot wall landing onto concrete.

EXAM:
DG HIP (WITH OR WITHOUT PELVIS) 2-3V LEFT

[pelvis ap]
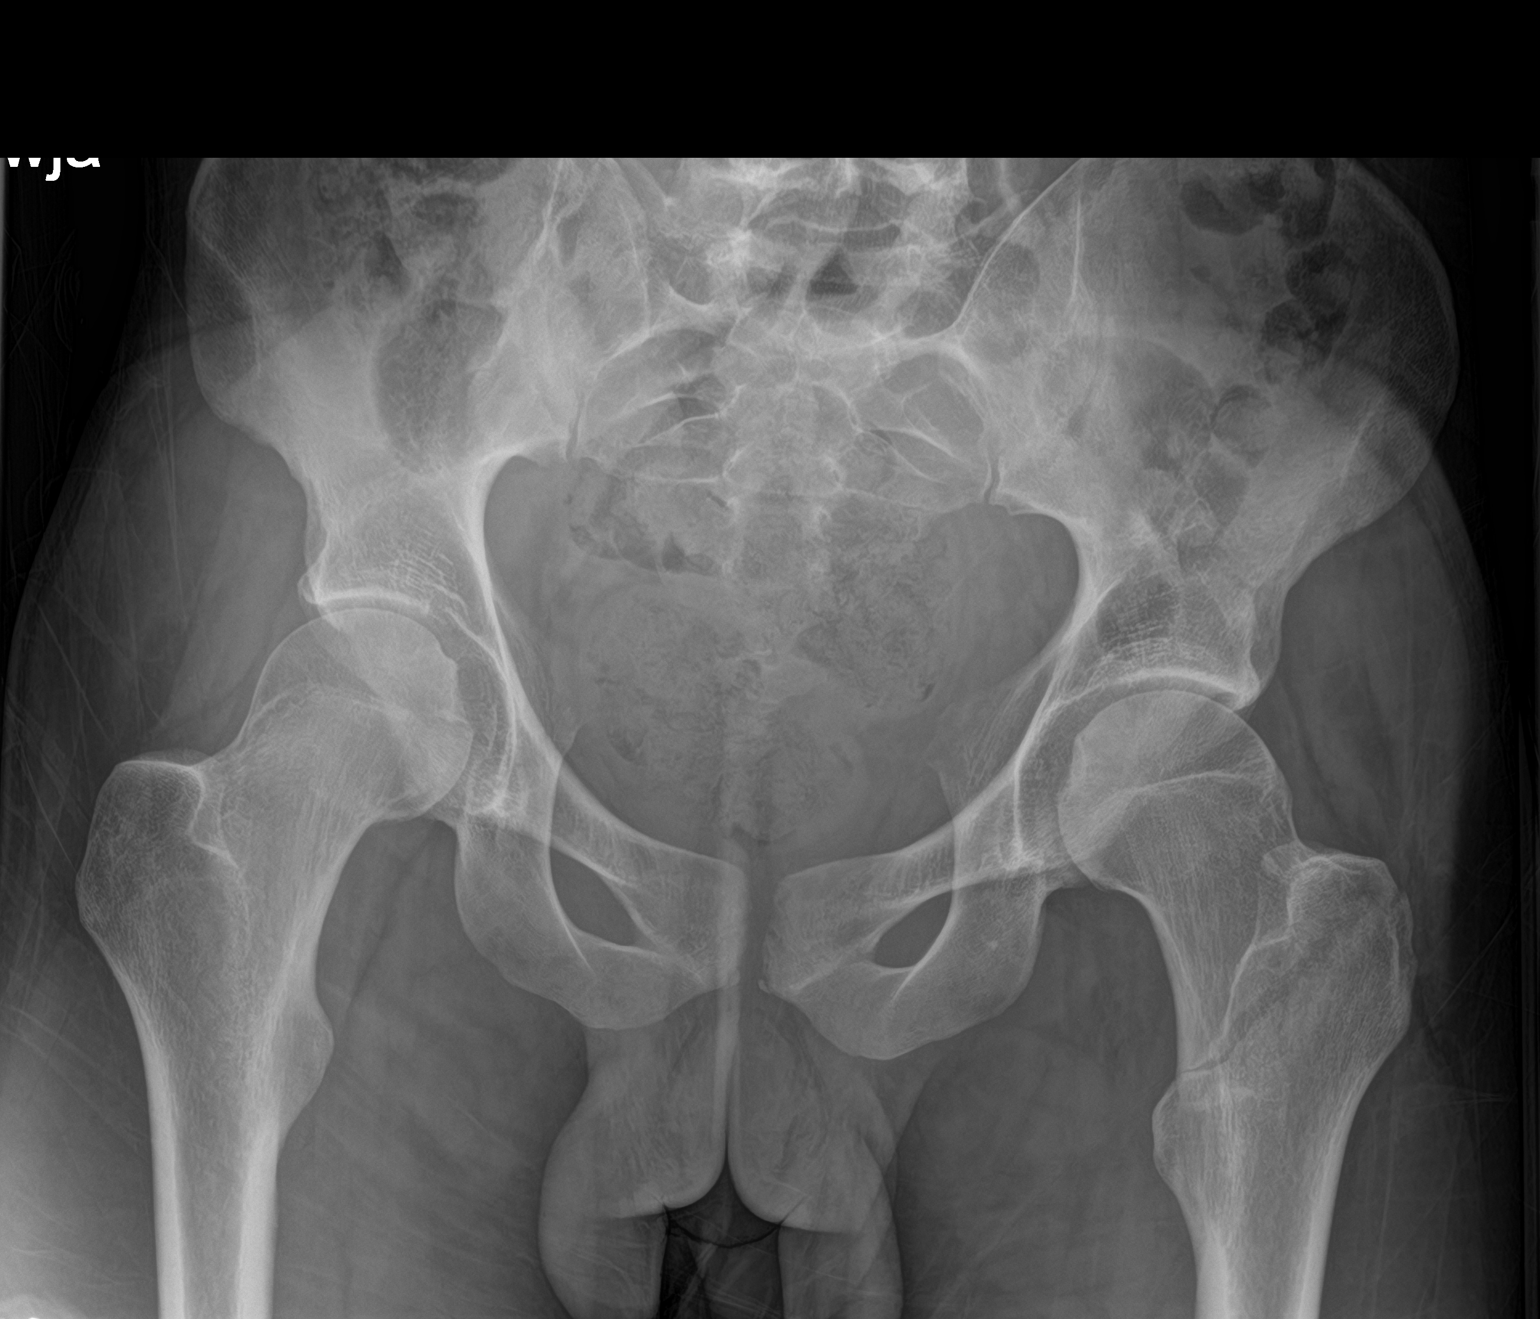

[hip ap]
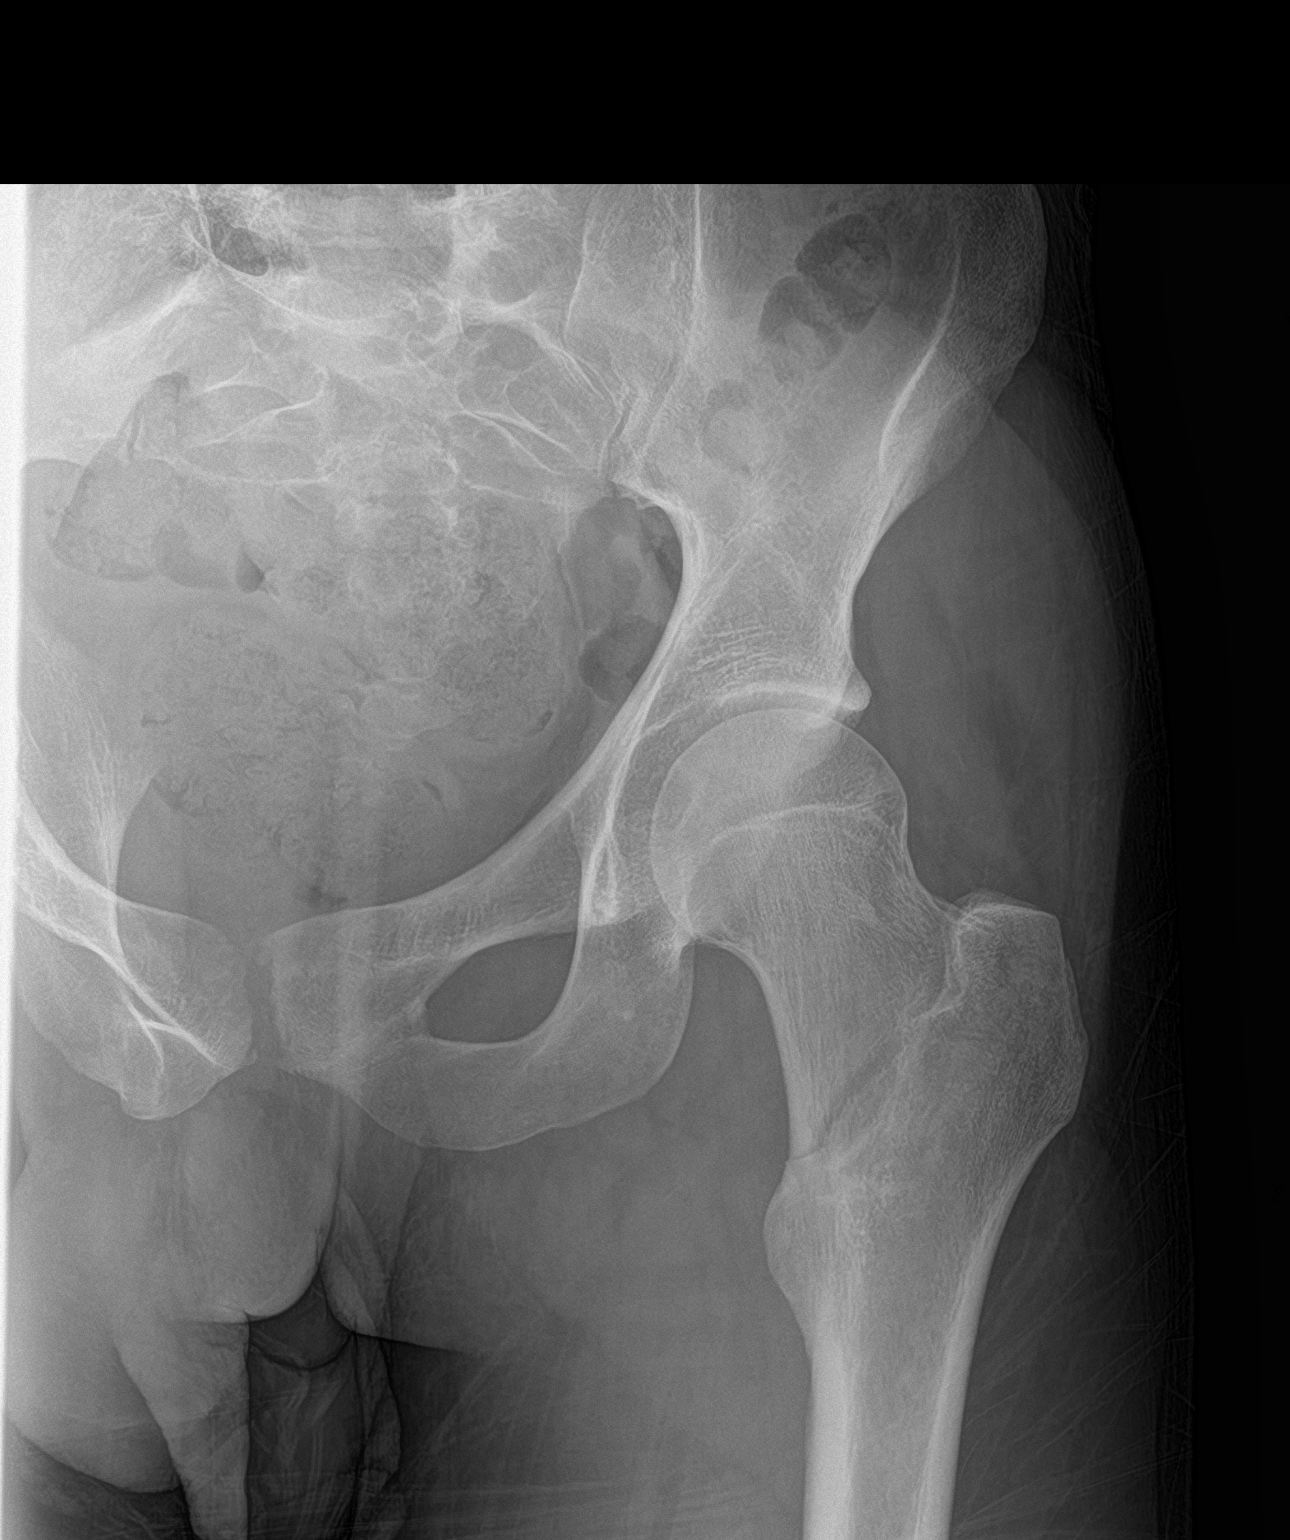

[hip lat]
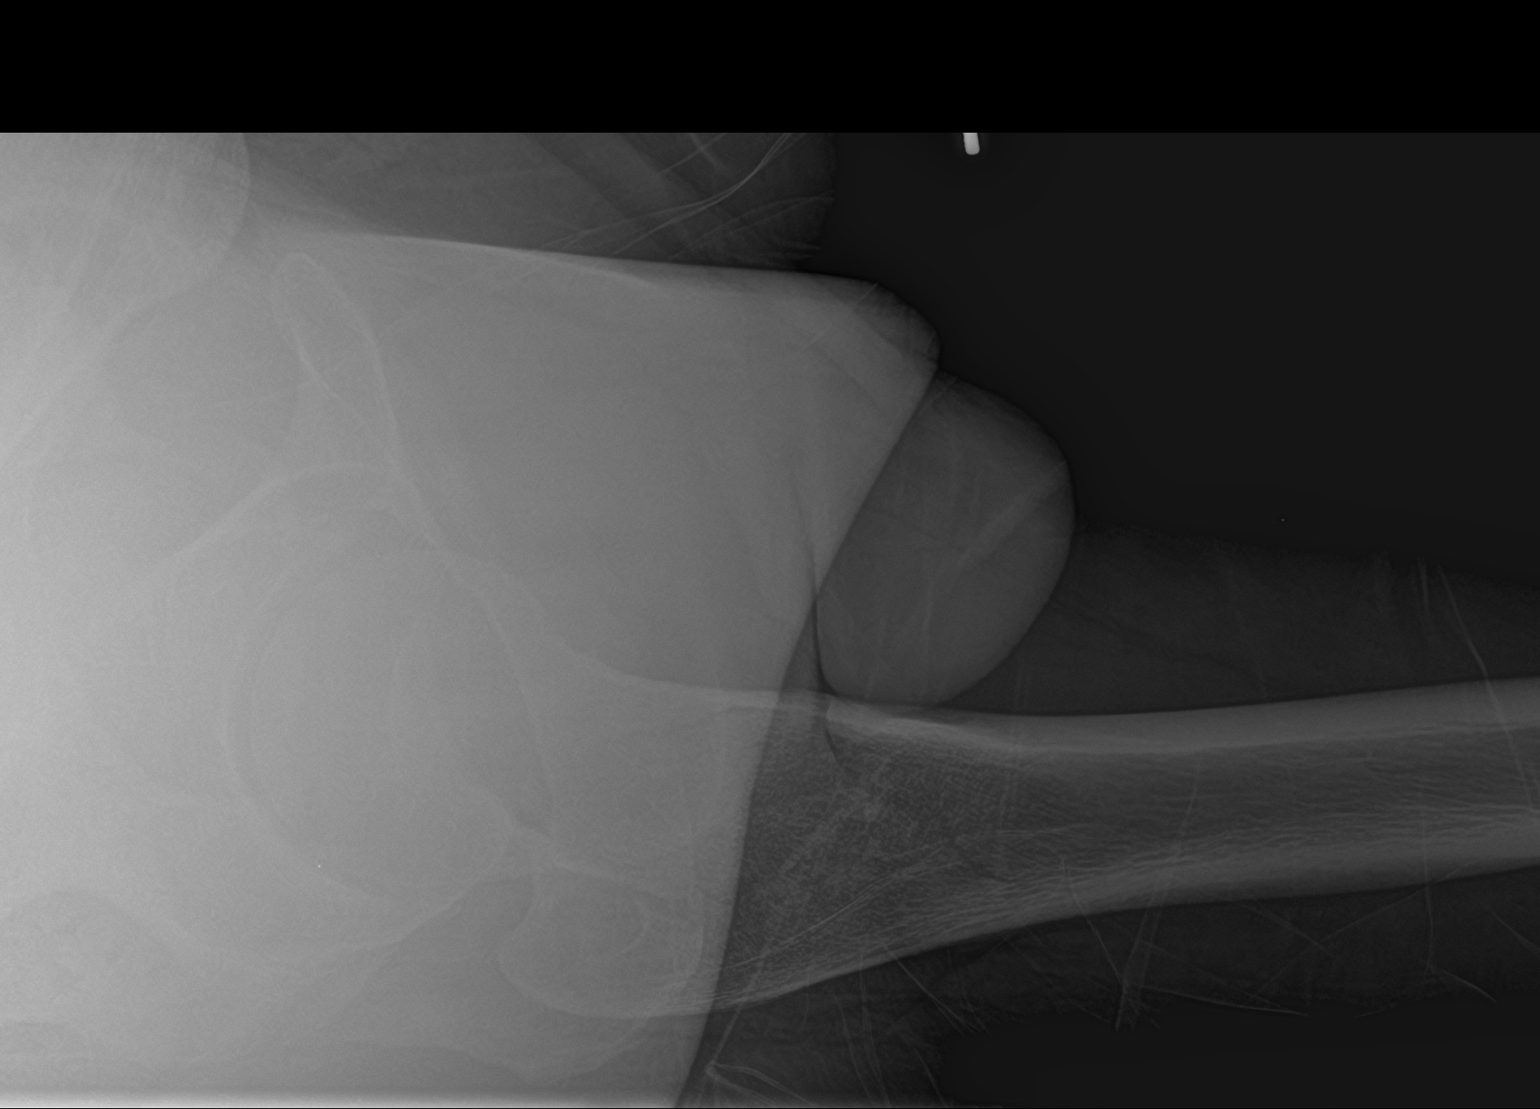

[3 of 3 positions shown; findings below may reference images not displayed]

FINDINGS: There is a very minimally displaced acute intertrochanteric left hip
fracture. No significant angulation. Bony pelvis and right proximal
femur as visualized appear intact.
IMPRESSION: 1. Acute minimally displaced intertrochanteric fracture of the left
hip.
